# Patient Record
Sex: Male | Born: 1960 | Race: White | Hispanic: No | Marital: Married | Smoking: Never smoker
Health system: Southern US, Community
[De-identification: ages and names within clinical notes are randomized; demographics above are authoritative.]

## PROBLEM LIST (undated history)

## (undated) DIAGNOSIS — N400 Enlarged prostate without lower urinary tract symptoms: Secondary | ICD-10-CM

## (undated) DIAGNOSIS — F419 Anxiety disorder, unspecified: Secondary | ICD-10-CM

## (undated) DIAGNOSIS — E7800 Pure hypercholesterolemia, unspecified: Secondary | ICD-10-CM

## (undated) DIAGNOSIS — N182 Chronic kidney disease, stage 2 (mild): Secondary | ICD-10-CM

## (undated) DIAGNOSIS — F32A Depression, unspecified: Secondary | ICD-10-CM

## (undated) DIAGNOSIS — M7660 Achilles tendinitis, unspecified leg: Secondary | ICD-10-CM

## (undated) DIAGNOSIS — I10 Essential (primary) hypertension: Secondary | ICD-10-CM

## (undated) DIAGNOSIS — G4730 Sleep apnea, unspecified: Secondary | ICD-10-CM

## (undated) DIAGNOSIS — J45909 Unspecified asthma, uncomplicated: Secondary | ICD-10-CM

## (undated) DIAGNOSIS — K219 Gastro-esophageal reflux disease without esophagitis: Secondary | ICD-10-CM

## (undated) DIAGNOSIS — C4490 Unspecified malignant neoplasm of skin, unspecified: Secondary | ICD-10-CM

## (undated) HISTORY — PX: WISDOM TOOTH EXTRACTION: SHX21

## (undated) HISTORY — DX: Achilles tendinitis, unspecified leg: M76.60

## (undated) HISTORY — DX: Pure hypercholesterolemia, unspecified: E78.00

## (undated) HISTORY — PX: UPPER GASTROINTESTINAL ENDOSCOPY: SHX188

## (undated) HISTORY — DX: Unspecified asthma, uncomplicated: J45.909

## (undated) HISTORY — DX: Essential (primary) hypertension: I10

## (undated) HISTORY — PX: NOSE SURGERY: SHX723

---

## 2009-04-03 ENCOUNTER — Ambulatory Visit: Payer: Self-pay

## 2012-05-23 DIAGNOSIS — N138 Other obstructive and reflux uropathy: Secondary | ICD-10-CM | POA: Insufficient documentation

## 2012-05-23 DIAGNOSIS — E291 Testicular hypofunction: Secondary | ICD-10-CM | POA: Insufficient documentation

## 2013-07-27 ENCOUNTER — Encounter: Payer: Self-pay | Admitting: Podiatry

## 2013-07-31 ENCOUNTER — Encounter: Payer: Self-pay | Admitting: Podiatry

## 2013-07-31 ENCOUNTER — Ambulatory Visit (INDEPENDENT_AMBULATORY_CARE_PROVIDER_SITE_OTHER): Payer: 59 | Admitting: Podiatry

## 2013-07-31 VITALS — BP 141/79 | HR 76 | Resp 16 | Ht 72.0 in | Wt 222.0 lb

## 2013-07-31 DIAGNOSIS — M722 Plantar fascial fibromatosis: Secondary | ICD-10-CM

## 2013-07-31 NOTE — Progress Notes (Signed)
Subjective:    Patient ID: Blake Strickland, male    DOB: 02-05-1961, 52 y.o.   MRN: [id number]  HPI Comments: May need a new pair of orthotics or have the old ones repaired  pt was scanned for new orthotics     Review of Systems  HENT:       Sinus problems    Respiratory: Positive for cough.        Objective:   Physical Exam: He was seen by the nurse today for followup of his plantar fasciitis and to have new orthotics made.        Assessment & Plan:  Assessment: History of plantar fasciitis.  Plan: New orthotics scan today. Followup with him as needed.

## 2013-09-14 ENCOUNTER — Encounter: Payer: Self-pay | Admitting: Podiatry

## 2013-09-14 NOTE — Progress Notes (Signed)
Sent pt postcard letting him know orthotics are in. 

## 2013-09-25 ENCOUNTER — Encounter: Payer: Self-pay | Admitting: Podiatry

## 2014-08-17 HISTORY — PX: COLONOSCOPY: SHX174

## 2014-12-10 DIAGNOSIS — T7840XA Allergy, unspecified, initial encounter: Secondary | ICD-10-CM | POA: Insufficient documentation

## 2014-12-10 DIAGNOSIS — Z79899 Other long term (current) drug therapy: Secondary | ICD-10-CM | POA: Insufficient documentation

## 2014-12-10 DIAGNOSIS — F32A Depression, unspecified: Secondary | ICD-10-CM | POA: Insufficient documentation

## 2014-12-10 DIAGNOSIS — S022XXA Fracture of nasal bones, initial encounter for closed fracture: Secondary | ICD-10-CM | POA: Insufficient documentation

## 2014-12-10 DIAGNOSIS — B019 Varicella without complication: Secondary | ICD-10-CM | POA: Insufficient documentation

## 2014-12-10 DIAGNOSIS — I10 Essential (primary) hypertension: Secondary | ICD-10-CM | POA: Insufficient documentation

## 2014-12-10 DIAGNOSIS — F329 Major depressive disorder, single episode, unspecified: Secondary | ICD-10-CM | POA: Insufficient documentation

## 2015-05-06 ENCOUNTER — Encounter: Payer: Self-pay | Admitting: Podiatry

## 2015-05-06 ENCOUNTER — Ambulatory Visit (INDEPENDENT_AMBULATORY_CARE_PROVIDER_SITE_OTHER): Payer: 59 | Admitting: Podiatry

## 2015-05-06 DIAGNOSIS — M722 Plantar fascial fibromatosis: Secondary | ICD-10-CM | POA: Diagnosis not present

## 2015-05-06 NOTE — Progress Notes (Signed)
Subjective:    Patient ID: Blake Strickland, male    DOB: 18-Mar-1961, 54 y.o.   MRN: [id number]  HPI he presents today for follow-up of his Achilles tendinitis. He states that currently it is not active nor is his plantar fasciitis. He would like to consider having another pair orthotics made.    Review of Systems  Musculoskeletal: Positive for gait problem.       Objective:   Physical Exam: 54 year old male no distress presents vital signs stable alert and oriented 3. Pulses are strongly palpable bilateral. Neurologic sensorium is intact for Semmes-Weinstein monofilament. Deep tendon reflexes are intact bilateral and muscle strength is 5 over 5 dorsiflexion plantar flexors and inverters everters on physical musculature is intact. Orthopedic evaluation and this rates all joints distal to the ankle for range of motion without crepitation. No tenderness on palpation of the plantar fascia or the Achilles tendon bilateral. Cutaneous evaluation demonstrates supple well-hydrated cutis no erythema edema saline as drainage or odor.          Assessment & Plan:  History of plantar fasciitis and Achilles tendinitis.  Plan: He was scanned for a new set of orthotics today.

## 2015-06-11 ENCOUNTER — Telehealth: Payer: Self-pay | Admitting: Podiatry

## 2015-06-11 NOTE — Telephone Encounter (Signed)
Pt called to see if orthotics are in. Please call pt and advise.

## 2015-06-11 NOTE — Telephone Encounter (Signed)
Called pt to advise otho's are in. He will pick up today.

## 2015-10-21 ENCOUNTER — Ambulatory Visit (INDEPENDENT_AMBULATORY_CARE_PROVIDER_SITE_OTHER): Payer: 59 | Admitting: Podiatry

## 2015-10-21 ENCOUNTER — Encounter: Payer: Self-pay | Admitting: Podiatry

## 2015-10-21 ENCOUNTER — Ambulatory Visit (INDEPENDENT_AMBULATORY_CARE_PROVIDER_SITE_OTHER): Payer: 59

## 2015-10-21 VITALS — BP 139/93 | HR 68 | Resp 16

## 2015-10-21 DIAGNOSIS — S92912A Unspecified fracture of left toe(s), initial encounter for closed fracture: Secondary | ICD-10-CM | POA: Diagnosis not present

## 2015-10-21 DIAGNOSIS — S99922A Unspecified injury of left foot, initial encounter: Secondary | ICD-10-CM

## 2015-10-21 NOTE — Progress Notes (Signed)
He presents today with a chief complaint of a painful fifth digit of the left foot where he kicked a door frame proximally one month ago. He states the toe is still sore.  Objective: Vital signs are stable alert and oriented 3. Pulses are palpable. As some swelling to the fifth digit left foot overlying the proximal phalanx. Mildly tender on palpation. Radiographs do confirm a nondisplaced fracture mid diaphyseal region of the proximal phalanx fifth digit left foot. Fracture is also confirmed by bone callus development.  Assessment: Fracture proximal phalanx fifth digit left foot non-comminuted nondisplaced.  Plan: I encouraged him to wrap the toe on a daily basis and get back to his regular routine he will go on to heal just fine.

## 2016-04-06 ENCOUNTER — Ambulatory Visit (INDEPENDENT_AMBULATORY_CARE_PROVIDER_SITE_OTHER): Payer: 59 | Admitting: Podiatry

## 2016-04-06 ENCOUNTER — Ambulatory Visit (INDEPENDENT_AMBULATORY_CARE_PROVIDER_SITE_OTHER): Payer: 59

## 2016-04-06 ENCOUNTER — Encounter: Payer: Self-pay | Admitting: Podiatry

## 2016-04-06 VITALS — BP 109/88 | HR 69 | Resp 12

## 2016-04-06 DIAGNOSIS — M7661 Achilles tendinitis, right leg: Secondary | ICD-10-CM | POA: Diagnosis not present

## 2016-04-06 DIAGNOSIS — S92912D Unspecified fracture of left toe(s), subsequent encounter for fracture with routine healing: Secondary | ICD-10-CM | POA: Diagnosis not present

## 2016-04-06 DIAGNOSIS — M7671 Peroneal tendinitis, right leg: Secondary | ICD-10-CM | POA: Diagnosis not present

## 2016-04-06 MED ORDER — MELOXICAM 15 MG PO TABS: 15.0000 mg | ORAL_TABLET | Freq: Every day | ORAL | 3 refills | Status: DC

## 2016-04-06 NOTE — Progress Notes (Signed)
Blake Strickland presents today with a chief complaint of pain to the lateral and posterior lateral aspect of the right ankle. He states that after he stopped running the pain has resolved. He states he wears his orthotics regularly.  Objective: Vital signs are stable alert and oriented 3. Pulses are palpable. No erythema edema saline as drainage or odor. He has some fluid collection around the peroneal tendons with no tenderness on palpation or on eversion and abduction against resistance. Redress do not demonstrate type of osseous abnormalities.  Assessment: Peroneal tendinitis possibly associated with poor shoe gear.  Plan: I will give him an oral anti-inflammatory and recommend new shoe gear with the lateral flange and I will follow-up with him as needed.

## 2016-08-20 DIAGNOSIS — E291 Testicular hypofunction: Secondary | ICD-10-CM | POA: Diagnosis not present

## 2016-08-20 DIAGNOSIS — Z79899 Other long term (current) drug therapy: Secondary | ICD-10-CM | POA: Diagnosis not present

## 2016-08-20 DIAGNOSIS — N401 Enlarged prostate with lower urinary tract symptoms: Secondary | ICD-10-CM | POA: Diagnosis not present

## 2017-02-02 ENCOUNTER — Other Ambulatory Visit: Payer: Self-pay | Admitting: Family Medicine

## 2017-02-02 ENCOUNTER — Encounter: Payer: Self-pay | Admitting: *Deleted

## 2017-02-02 DIAGNOSIS — Z8249 Family history of ischemic heart disease and other diseases of the circulatory system: Secondary | ICD-10-CM

## 2017-02-02 DIAGNOSIS — K429 Umbilical hernia without obstruction or gangrene: Secondary | ICD-10-CM | POA: Diagnosis not present

## 2017-02-19 ENCOUNTER — Ambulatory Visit
Admission: RE | Admit: 2017-02-19 | Discharge: 2017-02-19 | Disposition: A | Discharge status: Home / Self Care | Payer: 59 | Source: Ambulatory Visit | Attending: Family Medicine | Admitting: Family Medicine

## 2017-02-19 DIAGNOSIS — Z8249 Family history of ischemic heart disease and other diseases of the circulatory system: Secondary | ICD-10-CM | POA: Insufficient documentation

## 2017-02-22 ENCOUNTER — Ambulatory Visit (INDEPENDENT_AMBULATORY_CARE_PROVIDER_SITE_OTHER): Payer: 59 | Admitting: General Surgery

## 2017-02-22 ENCOUNTER — Encounter: Payer: Self-pay | Admitting: General Surgery

## 2017-02-22 VITALS — BP 140/80 | HR 74 | Resp 12 | Ht 72.0 in | Wt 239.0 lb

## 2017-02-22 DIAGNOSIS — K429 Umbilical hernia without obstruction or gangrene: Secondary | ICD-10-CM

## 2017-02-22 NOTE — Patient Instructions (Signed)
Umbilical Hernia, Adult A hernia is a bulge of tissue that pushes through an opening between muscles. An umbilical hernia happens in the abdomen, near the belly button (umbilicus). The hernia may contain tissues from the small intestine, large intestine, or fatty tissue covering the intestines (omentum). Umbilical hernias in adults tend to get worse over time, and they require surgical treatment. There are several types of umbilical hernias. You may have:  A hernia located just above or below the umbilicus (indirect hernia). This is the most common type of umbilical hernia in adults.  A hernia that forms through an opening formed by the umbilicus (direct hernia).  A hernia that comes and goes (reducible hernia). A reducible hernia may be visible only when you strain, lift something heavy, or cough. This type of hernia can be pushed back into the abdomen (reduced).  A hernia that traps abdominal tissue inside the hernia (incarcerated hernia). This type of hernia cannot be reduced.  A hernia that cuts off blood flow to the tissues inside the hernia (strangulated hernia). The tissues can start to die if this happens. This type of hernia requires emergency treatment.  What are the causes? An umbilical hernia happens when tissue inside the abdomen presses on a weak area of the abdominal muscles. What increases the risk? You may have a greater risk of this condition if you:  Are obese.  Have had several pregnancies.  Have a buildup of fluid inside your abdomen (ascites).  Have had surgery that weakens the abdominal muscles.  What are the signs or symptoms? The main symptom of this condition is a painless bulge at or near the belly button. A reducible hernia may be visible only when you strain, lift something heavy, or cough. Other symptoms may include:  Dull pain.  A feeling of pressure.  Symptoms of a strangulated hernia may include:  Pain that gets increasingly worse.  Nausea and  vomiting.  Pain when pressing on the hernia.  Skin over the hernia becoming red or purple.  Constipation.  Blood in the stool.  How is this diagnosed? This condition may be diagnosed based on:  A physical exam. You may be asked to cough or strain while standing. These actions increase the pressure inside your abdomen and force the hernia through the opening in your muscles. Your health care provider may try to reduce the hernia by pressing on it.  Your symptoms and medical history.  How is this treated? Surgery is the only treatment for an umbilical hernia. Surgery for a strangulated hernia is done as soon as possible. If you have a small hernia that is not incarcerated, you may need to lose weight before having surgery. Follow these instructions at home:  Lose weight, if told by your health care provider.  Do not try to push the hernia back in.  Watch your hernia for any changes in color or size. Tell your health care provider if any changes occur.  You may need to avoid activities that increase pressure on your hernia.  Do not lift anything that is heavier than 10 lb (4.5 kg) until your health care provider says that this is safe.  Take over-the-counter and prescription medicines only as told by your health care provider.  Keep all follow-up visits as told by your health care provider. This is important. Contact a health care provider if:  Your hernia gets larger.  Your hernia becomes painful. Get help right away if:  You develop sudden, severe pain near the   area of your hernia.  You have pain as well as nausea or vomiting.  You have pain and the skin over your hernia changes color.  You develop a fever. This information is not intended to replace advice given to you by your health care provider. Make sure you discuss any questions you have with your health care provider. Document Released: 01/03/2016 Document Revised: 04/05/2016 Document Reviewed:  01/03/2016 Elsevier Interactive Patient Education  2018 Elsevier Inc.  

## 2017-02-22 NOTE — Progress Notes (Signed)
Patient ID: Blake Strickland, male   DOB: 25-Jan-1961, 56 y.o.   MRN: [id number]  Chief Complaint  Patient presents with  . Umbilical Hernia    HPI Blake Strickland is a 56 y.o. male here today for a evaluation of a umbilical hernia. Patient states he noticed this area about six weeks ago while moving his bowels.  No pain or discomfort.   He lift weights while working out. Typically will lift 230 pounds or less.   Moves is bowels daily. No urinary symptoms. HPI  Past Medical History:  Diagnosis Date  . Achilles tendonitis   . Asthma   . Elevated cholesterol   . Hypertension     Past Surgical History:  Procedure Laterality Date  . COLONOSCOPY  2016  . NOSE SURGERY      No family history on file.  Social History Social History  Substance Use Topics  . Smoking status: Never Smoker  . Smokeless tobacco: Never Used  . Alcohol use Yes    No Known Allergies  Current Outpatient Prescriptions  Medication Sig Dispense Refill  . aspirin 81 MG chewable tablet Chew by mouth daily.    Marland Kitchen atorvastatin (LIPITOR) 80 MG tablet     . FLUoxetine (PROZAC) 10 MG tablet Take 10 mg by mouth daily.    Marland Kitchen lamoTRIgine (LAMICTAL) 200 MG tablet Take 200 mg by mouth at bedtime.  2  . lisinopril (PRINIVIL,ZESTRIL) 20 MG tablet Take 20 mg by mouth daily.    . simvastatin (ZOCOR) 20 MG tablet TAKE 1 TABLET (20 MG TOTAL) BY MOUTH NIGHTLY.  5   No current facility-administered medications for this visit.     Review of Systems Review of Systems  Constitutional: Negative.   Respiratory: Negative.   Cardiovascular: Negative.   Gastrointestinal: Negative.     Blood pressure 140/80, pulse 74, resp. rate 12, height 6' (1.829 m), weight 239 lb (108.4 kg).  Physical Exam Physical Exam  Constitutional: He is oriented to person, place, and time. He appears well-developed and well-nourished.  Eyes: Conjunctivae are normal. No scleral icterus.  Neck: Neck supple.  Cardiovascular: Normal rate, regular  rhythm and normal heart sounds.   Pulmonary/Chest: Effort normal and breath sounds normal.  Abdominal: Soft. Normal appearance and bowel sounds are normal. There is no hepatomegaly. There is no tenderness. A hernia ( small umbilical hernia) is present.    Lymphadenopathy:    He has no cervical adenopathy.  Neurological: He is alert and oriented to person, place, and time.  Skin: Skin is warm and dry.    Data Reviewed CBC and comprehensive metabolic panel from 05/12/2016 were both normal. Estimated GFR 63. Creatinine 1.2.   Assessment    Small, asymptomatic umbilical hernia.    Plan         Hernia precautions and incarceration were discussed with the patient. If they develop symptoms of an incarcerated hernia, they were encouraged to seek prompt medical attention.  Indication for elective repair were reviewed: Local discomfort with or without activity, increasing size.  Exceptionally low risk for incarceration (patient's main concern after consult thing Dr.Google). .  Patient to call if he desires to proceed with elective repair.   Based on his present exam, wouldn't dissipate a primary repair without the need for prosthetic mesh.  HPI, Physical Exam, Assessment and Plan have been scribed under the direction and in the presence of Donnalee Curry, MD.  [name], CMA  I have completed the exam and reviewed the above documentation  for accuracy and completeness.  I agree with the above.  Museum/gallery conservator has been used and any errors in dictation or transcription are unintentional.  Donnalee Curry, M.D., F.A.C.S.   [name] 02/22/2017, 2:43 PM

## 2017-04-26 DIAGNOSIS — R5381 Other malaise: Secondary | ICD-10-CM | POA: Diagnosis not present

## 2017-04-26 DIAGNOSIS — E785 Hyperlipidemia, unspecified: Secondary | ICD-10-CM | POA: Diagnosis not present

## 2017-04-26 DIAGNOSIS — Z0000 Encounter for general adult medical examination without abnormal findings: Secondary | ICD-10-CM | POA: Diagnosis not present

## 2017-05-14 DIAGNOSIS — J069 Acute upper respiratory infection, unspecified: Secondary | ICD-10-CM | POA: Diagnosis not present

## 2017-05-14 DIAGNOSIS — R05 Cough: Secondary | ICD-10-CM | POA: Diagnosis not present

## 2017-05-26 DIAGNOSIS — J019 Acute sinusitis, unspecified: Secondary | ICD-10-CM | POA: Diagnosis not present

## 2017-05-26 DIAGNOSIS — J40 Bronchitis, not specified as acute or chronic: Secondary | ICD-10-CM | POA: Diagnosis not present

## 2017-06-07 DIAGNOSIS — Z0000 Encounter for general adult medical examination without abnormal findings: Secondary | ICD-10-CM | POA: Diagnosis not present

## 2017-06-11 DIAGNOSIS — J019 Acute sinusitis, unspecified: Secondary | ICD-10-CM | POA: Diagnosis not present

## 2017-06-11 DIAGNOSIS — H6122 Impacted cerumen, left ear: Secondary | ICD-10-CM | POA: Diagnosis not present

## 2017-06-29 DIAGNOSIS — R7989 Other specified abnormal findings of blood chemistry: Secondary | ICD-10-CM | POA: Diagnosis not present

## 2017-06-29 DIAGNOSIS — R5383 Other fatigue: Secondary | ICD-10-CM | POA: Diagnosis not present

## 2017-06-29 DIAGNOSIS — Z0000 Encounter for general adult medical examination without abnormal findings: Secondary | ICD-10-CM | POA: Diagnosis not present

## 2017-06-29 DIAGNOSIS — R5381 Other malaise: Secondary | ICD-10-CM | POA: Diagnosis not present

## 2017-07-12 DIAGNOSIS — Z23 Encounter for immunization: Secondary | ICD-10-CM | POA: Diagnosis not present

## 2017-07-16 ENCOUNTER — Ambulatory Visit (INDEPENDENT_AMBULATORY_CARE_PROVIDER_SITE_OTHER): Payer: 59 | Admitting: Urology

## 2017-07-16 VITALS — BP 129/92 | HR 94 | Ht 72.0 in | Wt 231.1 lb

## 2017-07-16 DIAGNOSIS — N138 Other obstructive and reflux uropathy: Secondary | ICD-10-CM

## 2017-07-16 DIAGNOSIS — N401 Enlarged prostate with lower urinary tract symptoms: Secondary | ICD-10-CM

## 2017-07-16 DIAGNOSIS — R350 Frequency of micturition: Secondary | ICD-10-CM | POA: Diagnosis not present

## 2017-07-16 DIAGNOSIS — E291 Testicular hypofunction: Secondary | ICD-10-CM | POA: Diagnosis not present

## 2017-07-16 LAB — URINALYSIS, COMPLETE
BILIRUBIN UA: NEGATIVE
Glucose, UA: NEGATIVE
KETONES UA: NEGATIVE
NITRITE UA: NEGATIVE
PH UA: 5 (ref 5.0–7.5)
Protein, UA: NEGATIVE
UUROB: 0.2 mg/dL (ref 0.2–1.0)

## 2017-07-16 LAB — MICROSCOPIC EXAMINATION: RBC, UA: NONE SEEN /hpf (ref 0–?)

## 2017-07-16 LAB — BLADDER SCAN AMB NON-IMAGING

## 2017-07-16 NOTE — Progress Notes (Signed)
07/16/2017 1:46 PM   Blake Strickland [birth year] [id number]  Referring provider: [name], MD [address] [place], [place] [address]  Chief Complaint  Patient presents with  . Urinary Frequency    HPI: 56 y.o.No male presents for follow-up.  He has previous been followed for hypogonadism and was initially on topical testosterone and switch to Clomid for insurance reasons.  I last saw him at [facility] in October 2017.  He had been off Clomid for several months.  He recently saw Dr. Burnett Sheng and his testosterone level was low at 269 ng/dL.  PSA was 0.36.  He just started testosterone back Clomid back 1 week ago.  He has mild urinary frequency and urgency which is not bothersome.   PMH: Past Medical History:  Diagnosis Date  . Achilles tendonitis   . Asthma   . Elevated cholesterol   . Hypertension     Surgical History: Past Surgical History:  Procedure Laterality Date  . COLONOSCOPY  2016  . NOSE SURGERY      Home Medications:  Allergies as of 07/16/2017      Reactions   Other Other (See Comments)   Intolerance to eggs - "sinus drainage" Intolerance to eggs - "sinus drainage"      Medication List        Accurate as of 07/16/17  1:46 PM. Always use your most recent med list.          aspirin 81 MG chewable tablet Chew by mouth daily.   FLUoxetine 10 MG tablet Commonly known as:  PROZAC Take 10 mg by mouth daily.   lamoTRIgine 200 MG tablet Commonly known as:  LAMICTAL Take 200 mg by mouth at bedtime.   lisinopril 20 MG tablet Commonly known as:  PRINIVIL,ZESTRIL Take 20 mg by mouth daily.   simvastatin 20 MG tablet Commonly known as:  ZOCOR TAKE 1 TABLET (20 MG TOTAL) BY MOUTH NIGHTLY.       Allergies:  Allergies  Allergen Reactions  . Other Other (See Comments)    Intolerance to eggs - "sinus drainage" Intolerance to eggs - "sinus drainage"     Family History: No family history on file.  Social History:  reports  that  has never smoked. he has never used smokeless tobacco. He reports that he drinks alcohol. He reports that he does not use drugs.  ROS: UROLOGY Frequent Urination?: Yes Hard to postpone urination?: No Burning/pain with urination?: No Get up at night to urinate?: Yes Leakage of urine?: No Urine stream starts and stops?: No Trouble starting stream?: No Do you have to strain to urinate?: No Blood in urine?: No Urinary tract infection?: No Sexually transmitted disease?: No Injury to kidneys or bladder?: No Painful intercourse?: No Weak stream?: No Erection problems?: No Penile pain?: No  Gastrointestinal Nausea?: No Vomiting?: No Indigestion/heartburn?: No Diarrhea?: No Constipation?: No  Constitutional Fever: No Night sweats?: No Weight loss?: No Fatigue?: No  Skin Skin rash/lesions?: No Itching?: No  Eyes Blurred vision?: No Double vision?: No  Ears/Nose/Throat Sore throat?: No Sinus problems?: No  Hematologic/Lymphatic Swollen glands?: No Easy bruising?: No  Cardiovascular Leg swelling?: No Chest pain?: No  Respiratory Cough?: No Shortness of breath?: No  Endocrine Excessive thirst?: No  Musculoskeletal Back pain?: No Joint pain?: No  Neurological Headaches?: No Dizziness?: No  Psychologic Depression?: No Anxiety?: No  Physical Exam: BP (!) 129/92 (BP Location: Right Arm, Patient Position: Sitting, Cuff Size: Large)   Pulse 94  Ht 6' (1.829 m)   Wt 231 lb 1.6 oz (104.8 kg)   BMI 31.34 kg/m   Constitutional:  Alert and oriented, No acute distress. HEENT: [place] AT, moist mucus membranes.  Trachea midline, no masses. Cardiovascular: No clubbing, cyanosis, or edema. Respiratory: Normal respiratory effort, no increased work of breathing. GI: Abdomen is soft, nontender, nondistended, no abdominal masses GU: No CVA tenderness.  Prostate 40 g, smooth without nodules Skin: No rashes, bruises or suspicious lesions. Lymph: No cervical or  inguinal adenopathy. Neurologic: Grossly intact, no focal deficits, moving all 4 extremities. Psychiatric: Normal mood and affect.   Assessment & Plan:   1. Urine frequency Most likely secondary to mild BPH.  PVR by bladder scan was 74 mL.  He desires to hold off on treatment at this time.  - Bladder Scan (Post Void Residual) in office - Urinalysis, Complete  2.  Hypogonadism He just restarted Clomid and will check a testosterone level in 3-4 months.  Return in about 1 year (around 07/16/2018) for Recheck.    [name], MD  [facility] [address], [address] [place], [place] [address] [phone]

## 2017-11-09 ENCOUNTER — Other Ambulatory Visit: Payer: Self-pay

## 2017-11-09 MED ORDER — CLOMIPHENE CITRATE 50 MG PO TABS: ORAL_TABLET | ORAL | 3 refills | Status: DC

## 2017-11-15 ENCOUNTER — Other Ambulatory Visit: Payer: 59

## 2017-11-15 ENCOUNTER — Encounter: Payer: Self-pay | Admitting: Urology

## 2017-11-15 ENCOUNTER — Other Ambulatory Visit: Payer: Self-pay

## 2017-11-15 DIAGNOSIS — E291 Testicular hypofunction: Secondary | ICD-10-CM

## 2017-12-07 DIAGNOSIS — H6123 Impacted cerumen, bilateral: Secondary | ICD-10-CM | POA: Diagnosis not present

## 2017-12-07 DIAGNOSIS — J019 Acute sinusitis, unspecified: Secondary | ICD-10-CM | POA: Diagnosis not present

## 2017-12-30 DIAGNOSIS — K219 Gastro-esophageal reflux disease without esophagitis: Secondary | ICD-10-CM | POA: Diagnosis not present

## 2017-12-30 DIAGNOSIS — R05 Cough: Secondary | ICD-10-CM | POA: Diagnosis not present

## 2017-12-30 DIAGNOSIS — I10 Essential (primary) hypertension: Secondary | ICD-10-CM | POA: Diagnosis not present

## 2018-02-28 DIAGNOSIS — L570 Actinic keratosis: Secondary | ICD-10-CM | POA: Diagnosis not present

## 2018-02-28 DIAGNOSIS — L918 Other hypertrophic disorders of the skin: Secondary | ICD-10-CM | POA: Diagnosis not present

## 2018-02-28 DIAGNOSIS — L578 Other skin changes due to chronic exposure to nonionizing radiation: Secondary | ICD-10-CM | POA: Diagnosis not present

## 2018-04-08 DIAGNOSIS — I10 Essential (primary) hypertension: Secondary | ICD-10-CM | POA: Diagnosis not present

## 2018-05-03 DIAGNOSIS — Z23 Encounter for immunization: Secondary | ICD-10-CM | POA: Diagnosis not present

## 2018-05-30 DIAGNOSIS — Z23 Encounter for immunization: Secondary | ICD-10-CM | POA: Diagnosis not present

## 2018-07-18 ENCOUNTER — Ambulatory Visit: Payer: 59 | Admitting: Urology

## 2018-07-18 IMAGING — US US RETROPERITONEAL COMPLETE
1 series · 14 of 25 positions shown · non-contrast
Comparison: None.

CLINICAL DATA: Family history AAA

EXAM:
ULTRASOUND RETROPERITONEAL COMPLETE
TECHNIQUE: Ultrasound examination of the abdominal aorta was performed to
evaluate for abdominal aortic aneurysm. The common iliac arteries,
IVC, and kidneys were also evaluated.

[Series 1: us retroperitoneal complete · 0.28mm/px · 14 of 57 slices shown]
[im 1/57]
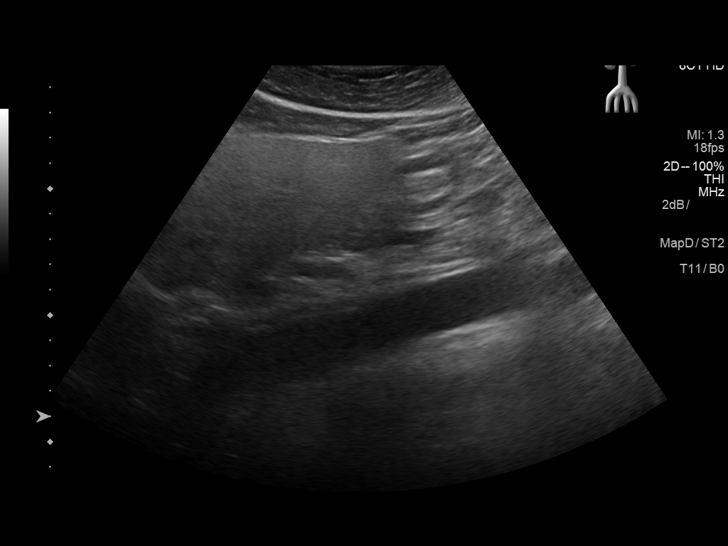
[im 5/57]
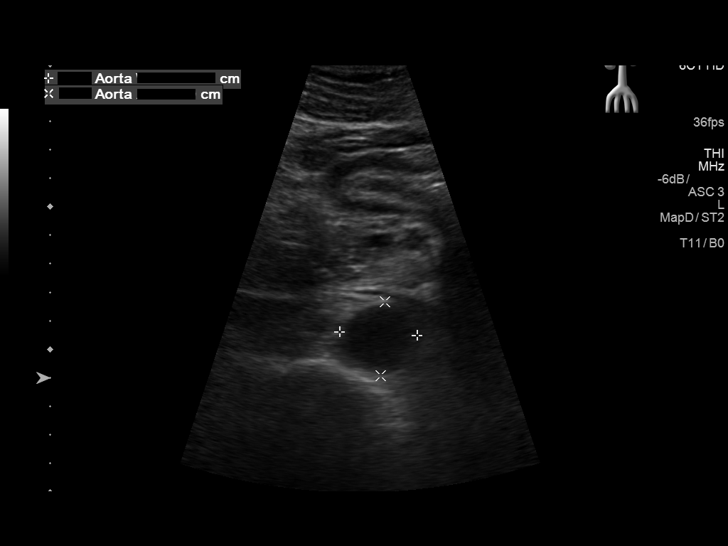
[im 10/57]
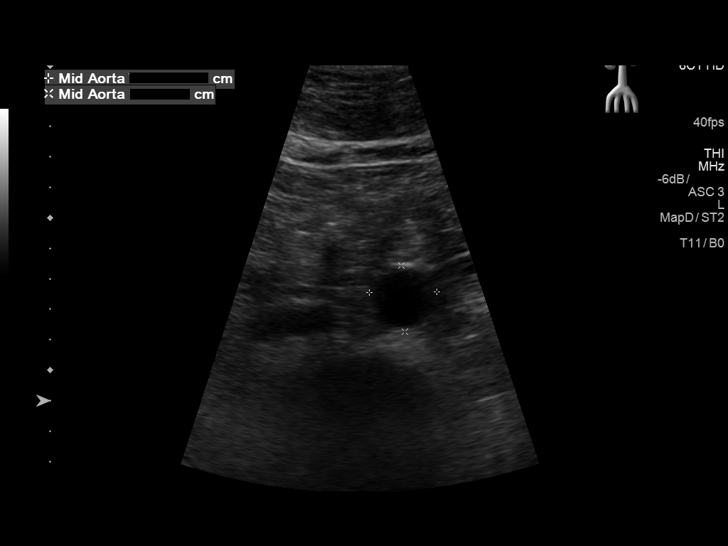
[im 15/57]
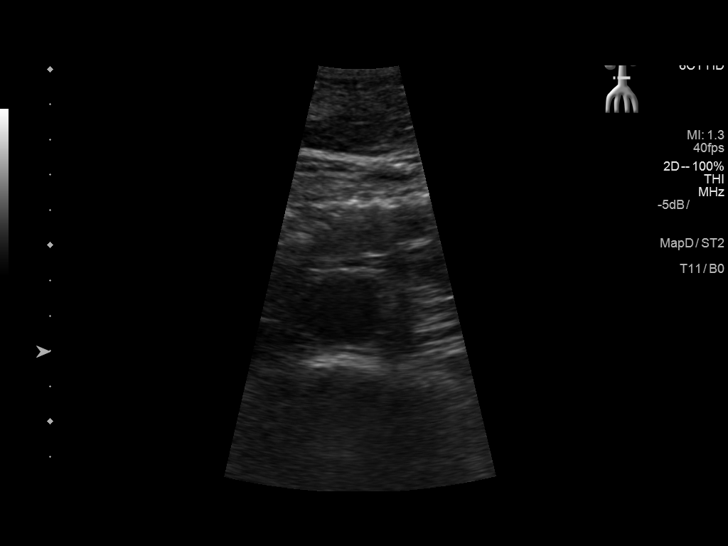
[im 19/57]
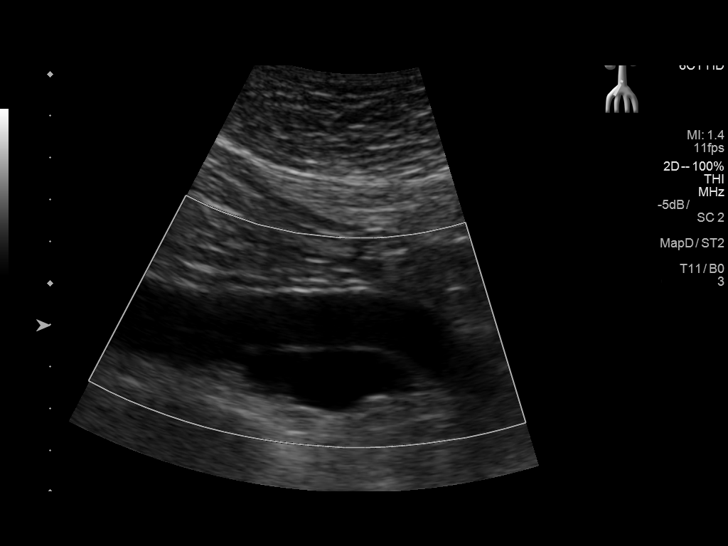
[im 22/57]
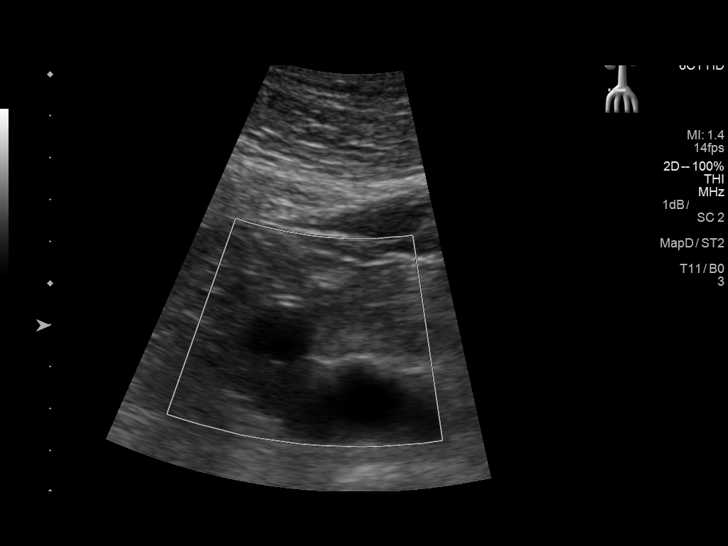
[im 26/57]
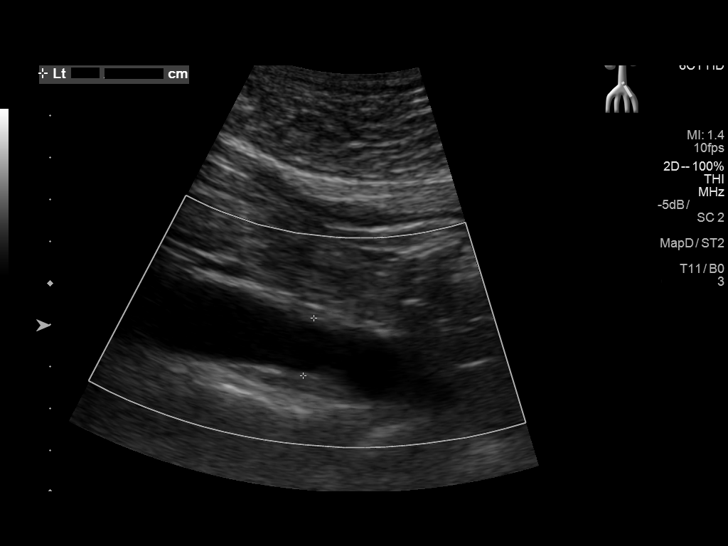
[im 31/57]
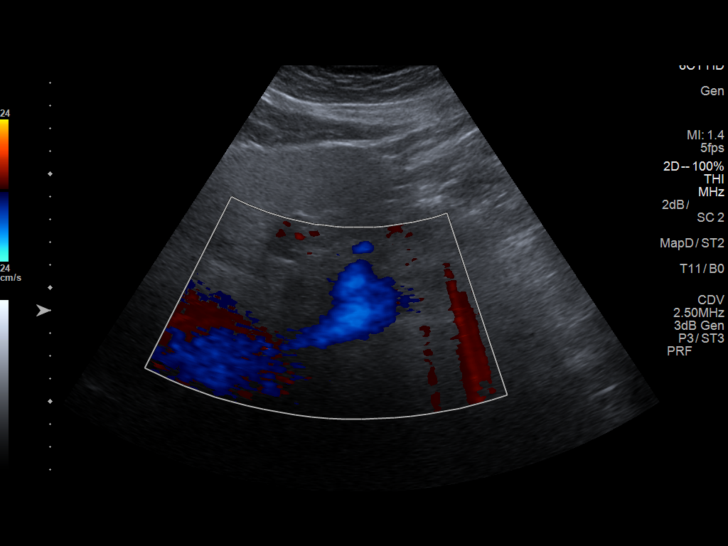
[im 36/57]
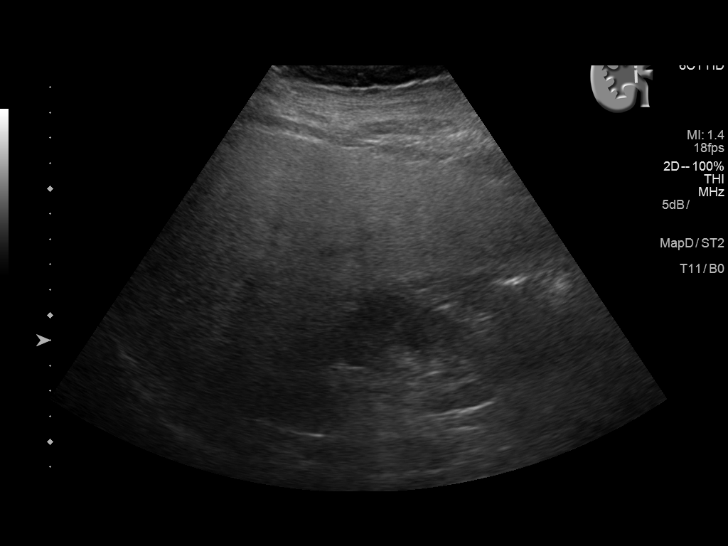
[im 38/57]
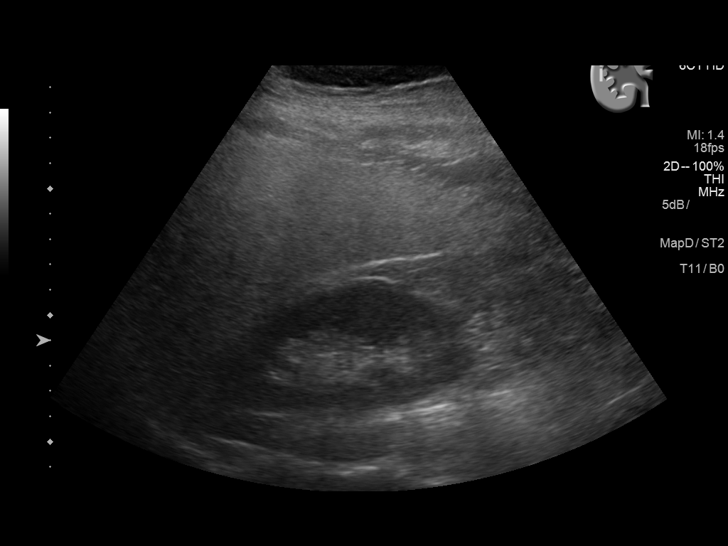
[im 43/57]
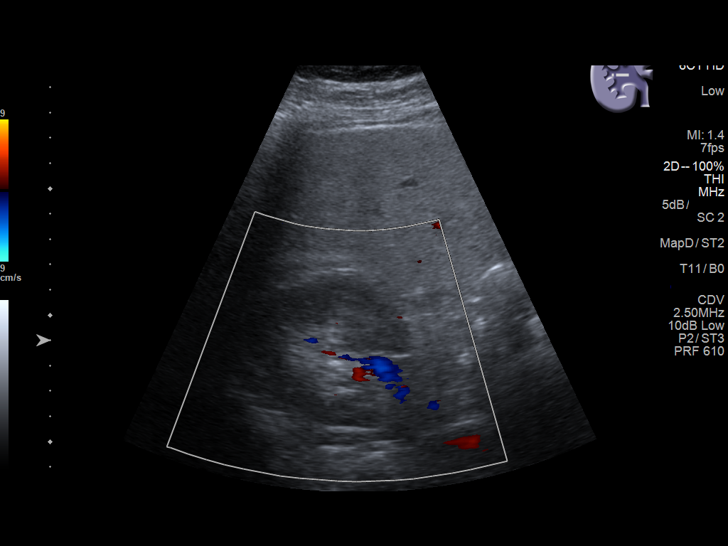
[im 47/57]
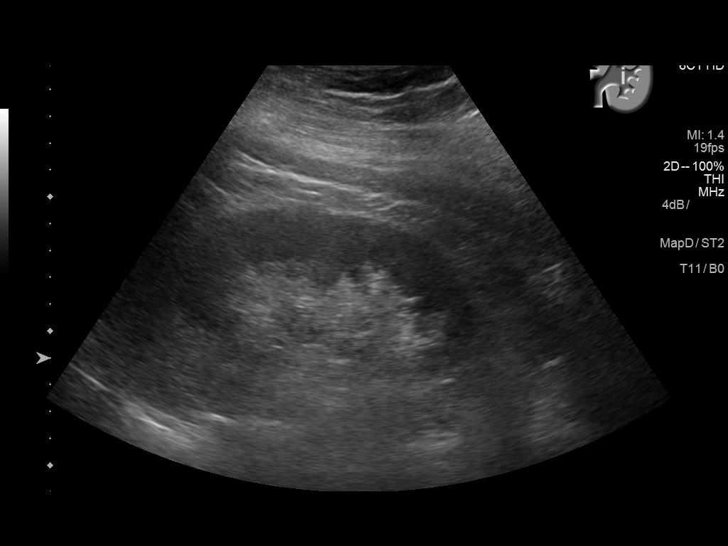
[im 52/57]
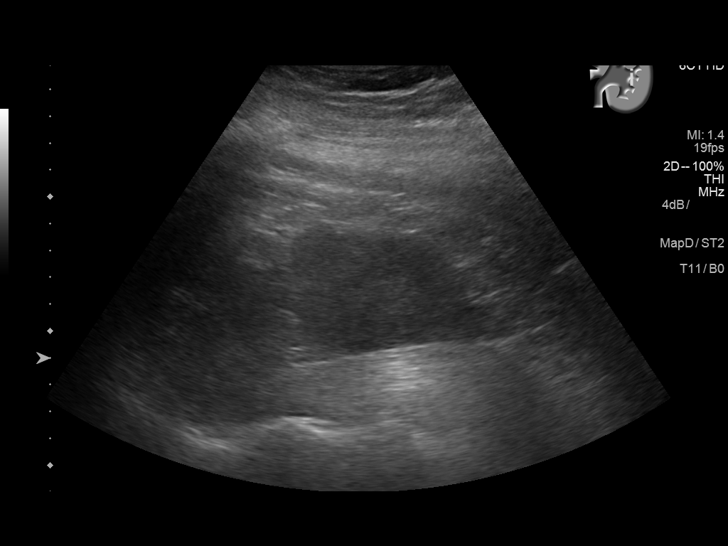
[im 57/57]
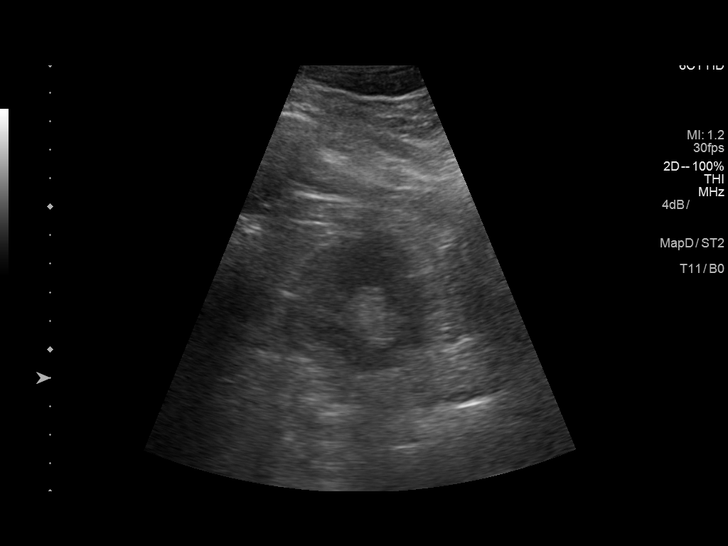

[14 of 25 positions shown; findings below may reference images not displayed]

FINDINGS: Abdominal Aorta

No aneurysm identified.

Maximum AP

Diameter:  2.8 cm

Maximum TRV

Diameter: 2.7 cm

Right Common Iliac Artery

No aneurysm identified, 1.4 cm maximally.

Left Common Iliac Artery

No aneurysm identified, 1.4 cm maximally.

IVC

No abnormality visualized.

Right Kidney

Length: 11.1 cm Echogenicity within normal limits. No mass or
hydronephrosis visualized.

Left Kidney

Length: 11.4 cm Echogenicity within normal limits. No mass or
hydronephrosis visualized.
IMPRESSION: No evidence of abdominal aortic aneurysm.

## 2018-08-26 ENCOUNTER — Ambulatory Visit: Payer: 59 | Admitting: Urology

## 2018-09-15 DIAGNOSIS — Z0000 Encounter for general adult medical examination without abnormal findings: Secondary | ICD-10-CM | POA: Diagnosis not present

## 2018-09-15 DIAGNOSIS — R739 Hyperglycemia, unspecified: Secondary | ICD-10-CM | POA: Diagnosis not present

## 2018-09-15 DIAGNOSIS — Z125 Encounter for screening for malignant neoplasm of prostate: Secondary | ICD-10-CM | POA: Diagnosis not present

## 2018-09-21 DIAGNOSIS — Z0000 Encounter for general adult medical examination without abnormal findings: Secondary | ICD-10-CM | POA: Diagnosis not present

## 2018-09-21 DIAGNOSIS — I10 Essential (primary) hypertension: Secondary | ICD-10-CM | POA: Diagnosis not present

## 2018-09-21 DIAGNOSIS — E785 Hyperlipidemia, unspecified: Secondary | ICD-10-CM | POA: Diagnosis not present

## 2018-10-14 DIAGNOSIS — Z79899 Other long term (current) drug therapy: Secondary | ICD-10-CM | POA: Diagnosis not present

## 2018-11-04 ENCOUNTER — Ambulatory Visit: Payer: 59 | Admitting: Urology

## 2019-01-13 ENCOUNTER — Ambulatory Visit: Payer: 59 | Admitting: Urology

## 2019-01-27 ENCOUNTER — Other Ambulatory Visit: Payer: Self-pay | Admitting: Nephrology

## 2019-01-27 ENCOUNTER — Other Ambulatory Visit: Payer: Self-pay

## 2019-01-27 DIAGNOSIS — N183 Chronic kidney disease, stage 3 unspecified: Secondary | ICD-10-CM

## 2019-01-31 ENCOUNTER — Encounter: Payer: Self-pay | Admitting: Urology

## 2019-01-31 ENCOUNTER — Ambulatory Visit (INDEPENDENT_AMBULATORY_CARE_PROVIDER_SITE_OTHER): Payer: 59 | Admitting: Urology

## 2019-01-31 ENCOUNTER — Other Ambulatory Visit: Payer: Self-pay

## 2019-01-31 VITALS — BP 132/81 | HR 74 | Ht 72.0 in | Wt 222.2 lb

## 2019-01-31 DIAGNOSIS — N401 Enlarged prostate with lower urinary tract symptoms: Secondary | ICD-10-CM | POA: Diagnosis not present

## 2019-01-31 DIAGNOSIS — R350 Frequency of micturition: Secondary | ICD-10-CM | POA: Diagnosis not present

## 2019-01-31 NOTE — Progress Notes (Signed)
01/31/2019 10:17 AM   Blake Strickland [birth year] [id number]  Referring provider: Maryland Pink, MD [address] [place],  [place] [address]  Chief Complaint  Patient presents with  . Follow-up    HPI: 58 year old male previously seen by me at [facility] for hypogonadism initially on topical testosterone and switching to Clomid.  He has mild lower urinary tract symptoms consisting of occasional urinary frequency and nocturia x1-2.  He feels his voiding symptoms have improved since starting an intermittent fasting diet.  He is no longer on Clomid.  PSA performed by his PCP January 2020 was stable at 0.21.  He recently saw a nephrologist for abnormal EGFR.Marland Kitchen  He is scheduled for a renal ultrasound.  There was also a note regarding microscopic hematuria however his recent UAs were reviewed and he has trace blood on dipstick however no significant RBCs on microscopy.   PMH: Past Medical History:  Diagnosis Date  . Achilles tendonitis   . Asthma   . Elevated cholesterol   . Hypertension     Surgical History: Past Surgical History:  Procedure Laterality Date  . COLONOSCOPY  2016  . NOSE SURGERY      Home Medications:  Allergies as of 01/31/2019      Reactions   Other Other (See Comments)   Intolerance to eggs - "sinus drainage" Intolerance to eggs - "sinus drainage"      Medication List       Accurate as of January 31, 2019 10:17 AM. If you have any questions, ask your nurse or doctor.        STOP taking these medications   aspirin 81 MG chewable tablet Stopped by: Abbie Sons, MD   clomiPHENE 50 MG tablet Commonly known as: CLOMID Stopped by: Abbie Sons, MD   fluticasone 50 MCG/ACT nasal spray Commonly known as: FLONASE Stopped by: Abbie Sons, MD   lisinopril 20 MG tablet Commonly known as: ZESTRIL Stopped by: Abbie Sons, MD     TAKE these medications   DEPLIN PO Take by mouth.   FLUoxetine 10 MG tablet Commonly known  as: PROZAC Take 30 mg by mouth daily. What changed: Another medication with the same name was removed. Continue taking this medication, and follow the directions you see here. Changed by: Abbie Sons, MD   lamoTRIgine 100 MG tablet Commonly known as: LAMICTAL Take 300 mg by mouth daily. What changed: Another medication with the same name was removed. Continue taking this medication, and follow the directions you see here. Changed by: Abbie Sons, MD   simvastatin 20 MG tablet Commonly known as: ZOCOR Take 20 mg by mouth daily.   telmisartan 40 MG tablet Commonly known as: MICARDIS TAKE 1 TABLET BY MOUTH EVERY DAY       Allergies:  Allergies  Allergen Reactions  . Other Other (See Comments)    Intolerance to eggs - "sinus drainage" Intolerance to eggs - "sinus drainage"     Family History: No family history on file.  Social History:  reports that he has never smoked. He has never used smokeless tobacco. He reports current alcohol use. He reports that he does not use drugs.  ROS: UROLOGY Frequent Urination?: No Hard to postpone urination?: No Burning/pain with urination?: No Get up at night to urinate?: Yes Leakage of urine?: No Urine stream starts and stops?: No Trouble starting stream?: No Do you have to strain to urinate?: No Blood in urine?: Yes Urinary   01/31/2019 10:17 AM   Blake Strickland 04/22/1961 [id number]  Referring provider: Hedrick, James, MD [address] [facility] [place],  [place] [address]  Chief Complaint  Patient presents with  . Follow-up    HPI: 58-year-old male previously seen by me at [facility] for hypogonadism initially on topical testosterone and switching to Clomid.  He has mild lower urinary tract symptoms consisting of occasional urinary frequency and nocturia x1-2.  He feels his voiding symptoms have improved since starting an intermittent fasting diet.  He is no longer on Clomid.  PSA performed by his PCP January 2020 was stable at 0.21.  He recently saw a nephrologist for abnormal EGFR..  He is scheduled for a renal ultrasound.  There was also a note regarding microscopic hematuria however his recent UAs were reviewed and he has trace blood on dipstick however no significant RBCs on microscopy.   PMH: Past Medical History:  Diagnosis Date  . Achilles tendonitis   . Asthma   . Elevated cholesterol   . Hypertension     Surgical History: Past Surgical History:  Procedure Laterality Date  . COLONOSCOPY  2016  . NOSE SURGERY      Home Medications:  Allergies as of 01/31/2019      Reactions   Other Other (See Comments)   Intolerance to eggs - "sinus drainage" Intolerance to eggs - "sinus drainage"      Medication List       Accurate as of January 31, 2019 10:17 AM. If you have any questions, ask your nurse or doctor.        STOP taking these medications   aspirin 81 MG chewable tablet Stopped by: Scott C Stoioff, MD   clomiPHENE 50 MG tablet Commonly known as: CLOMID Stopped by: Scott C Stoioff, MD   fluticasone 50 MCG/ACT nasal spray Commonly known as: FLONASE Stopped by: Scott C Stoioff, MD   lisinopril 20 MG tablet Commonly known as: ZESTRIL Stopped by: Scott C Stoioff, MD     TAKE these medications   DEPLIN PO Take by mouth.   FLUoxetine 10 MG tablet Commonly known  as: PROZAC Take 30 mg by mouth daily. What changed: Another medication with the same name was removed. Continue taking this medication, and follow the directions you see here. Changed by: Scott C Stoioff, MD   lamoTRIgine 100 MG tablet Commonly known as: LAMICTAL Take 300 mg by mouth daily. What changed: Another medication with the same name was removed. Continue taking this medication, and follow the directions you see here. Changed by: Scott C Stoioff, MD   simvastatin 20 MG tablet Commonly known as: ZOCOR Take 20 mg by mouth daily.   telmisartan 40 MG tablet Commonly known as: MICARDIS TAKE 1 TABLET BY MOUTH EVERY DAY       Allergies:  Allergies  Allergen Reactions  . Other Other (See Comments)    Intolerance to eggs - "sinus drainage" Intolerance to eggs - "sinus drainage"     Family History: No family history on file.  Social History:  reports that he has never smoked. He has never used smokeless tobacco. He reports current alcohol use. He reports that he does not use drugs.  ROS: UROLOGY Frequent Urination?: No Hard to postpone urination?: No Burning/pain with urination?: No Get up at night to urinate?: Yes Leakage of urine?: No Urine stream starts and stops?: No Trouble starting stream?: No Do you have to strain to urinate?: No Blood in urine?: Yes Urinary

## 2019-02-01 LAB — URINALYSIS, COMPLETE
Bilirubin, UA: NEGATIVE
Glucose, UA: NEGATIVE
Ketones, UA: NEGATIVE
Leukocytes,UA: NEGATIVE
Nitrite, UA: NEGATIVE
Protein,UA: NEGATIVE
Specific Gravity, UA: 1.02 (ref 1.005–1.030)
Urobilinogen, Ur: 0.2 mg/dL (ref 0.2–1.0)
pH, UA: 5.5 (ref 5.0–7.5)

## 2019-02-01 LAB — MICROSCOPIC EXAMINATION
Bacteria, UA: NONE SEEN
Epithelial Cells (non renal): NONE SEEN /hpf (ref 0–10)
RBC, Urine: NONE SEEN /hpf (ref 0–2)

## 2019-02-07 ENCOUNTER — Encounter: Payer: Self-pay | Admitting: Urology

## 2019-02-07 ENCOUNTER — Other Ambulatory Visit: Payer: 59

## 2019-02-07 ENCOUNTER — Telehealth: Payer: Self-pay | Admitting: Urology

## 2019-02-07 NOTE — Telephone Encounter (Signed)
Pt LMOM and would like a call back about his labs, he has questions. Please advise.

## 2019-02-07 NOTE — Telephone Encounter (Signed)
Spoke to patient about his urine results. Patient understands the result now.

## 2019-02-28 ENCOUNTER — Other Ambulatory Visit: Payer: 59

## 2019-03-01 ENCOUNTER — Ambulatory Visit: Payer: 59 | Admitting: Urology

## 2019-03-20 ENCOUNTER — Other Ambulatory Visit: Payer: 59

## 2019-04-14 ENCOUNTER — Ambulatory Visit
Admission: RE | Admit: 2019-04-14 | Discharge: 2019-04-14 | Disposition: A | Discharge status: Home / Self Care | Payer: 59 | Source: Ambulatory Visit | Attending: Nephrology | Admitting: Nephrology

## 2019-04-14 DIAGNOSIS — N183 Chronic kidney disease, stage 3 unspecified: Secondary | ICD-10-CM

## 2019-06-27 DIAGNOSIS — D239 Other benign neoplasm of skin, unspecified: Secondary | ICD-10-CM

## 2019-06-27 HISTORY — DX: Other benign neoplasm of skin, unspecified: D23.9

## 2019-09-05 DIAGNOSIS — D229 Melanocytic nevi, unspecified: Secondary | ICD-10-CM

## 2019-09-05 HISTORY — DX: Melanocytic nevi, unspecified: D22.9

## 2019-12-04 ENCOUNTER — Other Ambulatory Visit: Payer: Self-pay

## 2019-12-04 ENCOUNTER — Encounter: Payer: Self-pay | Admitting: Dermatology

## 2019-12-04 ENCOUNTER — Ambulatory Visit (INDEPENDENT_AMBULATORY_CARE_PROVIDER_SITE_OTHER): Payer: 59 | Admitting: Dermatology

## 2019-12-04 DIAGNOSIS — Z86018 Personal history of other benign neoplasm: Secondary | ICD-10-CM | POA: Diagnosis not present

## 2019-12-04 DIAGNOSIS — Z1283 Encounter for screening for malignant neoplasm of skin: Secondary | ICD-10-CM

## 2019-12-04 DIAGNOSIS — L918 Other hypertrophic disorders of the skin: Secondary | ICD-10-CM

## 2019-12-04 DIAGNOSIS — D235 Other benign neoplasm of skin of trunk: Secondary | ICD-10-CM

## 2019-12-04 DIAGNOSIS — L814 Other melanin hyperpigmentation: Secondary | ICD-10-CM

## 2019-12-04 DIAGNOSIS — L578 Other skin changes due to chronic exposure to nonionizing radiation: Secondary | ICD-10-CM

## 2019-12-04 DIAGNOSIS — D229 Melanocytic nevi, unspecified: Secondary | ICD-10-CM

## 2019-12-04 DIAGNOSIS — K429 Umbilical hernia without obstruction or gangrene: Secondary | ICD-10-CM

## 2019-12-04 DIAGNOSIS — D239 Other benign neoplasm of skin, unspecified: Secondary | ICD-10-CM

## 2019-12-04 DIAGNOSIS — L821 Other seborrheic keratosis: Secondary | ICD-10-CM

## 2019-12-04 DIAGNOSIS — D1800 Hemangioma unspecified site: Secondary | ICD-10-CM

## 2019-12-04 NOTE — Progress Notes (Signed)
Follow-Up Visit   Subjective  Blake Strickland is a 59 y.o. male who presents for the following: Annual Exam (Hx of severe dysplastic nevus - patient has noticed new lesions that are changing on the R inner thigh (about 2 months)  and R forearm (a scar from a long time ago but coloration has changed). ). Patient presents for skin cancer screening, mole check and total body skin exam.  The following portions of the chart were reviewed this encounter and updated as appropriate: Tobacco  Allergies  Meds  Problems  Med Hx  Surg Hx  Fam Hx     Review of Systems: No other skin or systemic complaints.  Objective  Well appearing patient in no apparent distress; mood and affect are within normal limits.  A full examination was performed including scalp, head, eyes, ears, nose, lips, neck, chest, axillae, abdomen, back, buttocks, bilateral upper extremities, bilateral lower extremities, hands, feet, fingers, toes, fingernails, and toenails. All findings within normal limits unless otherwise noted below.  Objective  R thigh near the groin:    Objective  R lat clavicle: Scar with no evidence of recurrence.   Objective  umbilical: Herniation   Assessment & Plan  Dermatofibroma R thigh near the groin  Benign, observe.    History of dysplastic nevus R lat clavicle  Clear. Observe for recurrence. Call clinic for new or changing lesions.  Recommend regular skin exams, daily broad-spectrum spf 30+ sunscreen use, and photoprotection.     Umbilical hernia without obstruction and without gangrene umbilical Discuss with PCP Observe    Seborrheic Keratoses - Stuck-on, waxy, tan-brown papules and plaques  - Discussed benign etiology and prognosis. - Observe - Call for any changes  Actinic Damage - diffuse scaly erythematous macules with underlying dyspigmentation - Recommend daily broad spectrum sunscreen SPF 30+ to sun-exposed areas, reapply every 2 hours as needed.  - Call for  new or changing lesions.  Lentigines - Scattered tan macules - Discussed due to sun exposure - Benign, observe - Call for any changes  Hemangiomas - Red papules - Discussed benign nature - Observe - Call for any changes  Melanocytic Nevi - Tan-brown and/or pink-flesh-colored symmetric macules and papules - Benign appearing on exam today - Observation - Call clinic for new or changing moles - Recommend daily use of broad spectrum spf 30+ sunscreen to sun-exposed areas.   Acrochordons (Skin Tags) - Fleshy, skin-colored pedunculated papules - Benign appearing.  - Observe. - If desired, they can be removed with an in office procedure that is not covered by insurance. - Please call the clinic if you notice any new or changing lesions.  Procedure: Skin tag removal Informed consent:  Discussed risks (permanent scarring, infection, pain, bleeding, bruising, redness, and recurrence of the lesion) and benefits of the procedure, as well as the alternatives.  He is aware that skin tags are benign lesions, and their removal is often not considered medically necessary.  Informed consent was obtained. Anesthesia: Lidocaine with epinephrine  The area was prepared in a standard fashion. Snip removal was performed.   Antibiotic ointment and a sterile dressing were applied.   The patient tolerated procedure well. The patient was instructed on post-op care.   Number of lesions removed:  2 of the L axilla   Skin cancer screening performed today.  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   Luther Redo, CMA, am acting as scribe for Sarina Ser, MD .  Documentation: I have reviewed the above documentation for accuracy and completeness, and I agree with the above.  Sarina Ser, MD

## 2019-12-05 ENCOUNTER — Encounter: Payer: Self-pay | Admitting: Dermatology

## 2020-01-31 ENCOUNTER — Ambulatory Visit: Payer: 59 | Admitting: Urology

## 2020-03-28 ENCOUNTER — Ambulatory Visit: Payer: BC Managed Care – PPO | Admitting: Urology

## 2020-03-28 ENCOUNTER — Other Ambulatory Visit: Payer: Self-pay

## 2020-03-28 ENCOUNTER — Encounter: Payer: Self-pay | Admitting: Urology

## 2020-03-28 VITALS — BP 128/99 | HR 88 | Temp 98.0°F | Ht 72.0 in | Wt 220.0 lb

## 2020-03-28 DIAGNOSIS — N401 Enlarged prostate with lower urinary tract symptoms: Secondary | ICD-10-CM | POA: Diagnosis not present

## 2020-03-28 DIAGNOSIS — R350 Frequency of micturition: Secondary | ICD-10-CM

## 2020-03-28 LAB — BLADDER SCAN AMB NON-IMAGING: Scan Result: 174

## 2020-03-28 NOTE — Progress Notes (Signed)
03/28/2020 11:10 AM   Blake Strickland 09-Jul-1961 [id number]  Referring provider: [name], MD [address] [place],  [place] [address]  Chief Complaint  Patient presents with  . Benign Prostatic Hypertrophy    Urologic history: 1.  BPH with LUTS  2.  Hypogonadism -Previously on testosterone and Clomid   HPI: 59 y.o. male presents for annual follow-up.   Last visit 01/31/2019  Feels lower urinary tract symptoms are stable.  Does have intermittent urinary stream, nocturia x2 and sensation incomplete emptying  Denies dysuria, gross hematuria  Denies flank, abdominal or pelvic pain  PSA 09/05/2018 0.21    PMH: Past Medical History:  Diagnosis Date  . Achilles tendonitis   . Asthma   . Atypical mole 09/05/2019   R lat clavicle   . Elevated cholesterol   . Hypertension     Surgical History: Past Surgical History:  Procedure Laterality Date  . COLONOSCOPY  2016  . NOSE SURGERY      Home Medications:  Allergies as of 03/28/2020      Reactions   Other Other (See Comments)   Intolerance to eggs - "sinus drainage" Intolerance to eggs - "sinus drainage"      Medication List       Accurate as of March 28, 2020 11:10 AM. If you have any questions, ask your nurse or doctor.        DEPLIN PO Take by mouth.   FLUoxetine 10 MG tablet Commonly known as: PROZAC Take 30 mg by mouth daily.   lamoTRIgine 100 MG tablet Commonly known as: LAMICTAL Take 300 mg by mouth daily.   One Daily tablet Take by mouth.   simvastatin 20 MG tablet Commonly known as: ZOCOR Take 20 mg by mouth daily.   telmisartan 40 MG tablet Commonly known as: MICARDIS TAKE 1 TABLET BY MOUTH EVERY DAY       Allergies:  Allergies  Allergen Reactions  . Other Other (See Comments)    Intolerance to eggs - "sinus drainage" Intolerance to eggs - "sinus drainage"     Family History: No family history on file.  Social History:  reports that he has  never smoked. He has never used smokeless tobacco. He reports current alcohol use. He reports that he does not use drugs.   Physical Exam: There were no vitals taken for this visit.  Constitutional:  Alert and oriented, No acute distress. HEENT: [place] AT, moist mucus membranes.  Trachea midline, no masses. Cardiovascular: No clubbing, cyanosis, or edema. Respiratory: Normal respiratory effort, no increased work of breathing. Skin: No rashes, bruises or suspicious lesions. Neurologic: Grossly intact, no focal deficits, moving all 4 extremities. Psychiatric: Normal mood and affect.   Assessment & Plan:    1. Benign prostatic hyperplasia with LUTS  Bladder scan PVR today 174 mL  We discussed this is a moderate residual, he is not on medical management and would recommend starting to see if his emptying can be improved.  Rx tamsulosin sent to pharmacy.  We discussed potential side effects including lightheadedness and ejaculatory dysfunction.  Follow-up 4-6 weeks for IPSS and repeat PVR  2.  Incomplete bladder emptying  As above   Blake Altes, MD  [facility] [address] [place], [place] [address] [phone]

## 2020-03-29 ENCOUNTER — Encounter: Payer: Self-pay | Admitting: Urology

## 2020-03-29 MED ORDER — TAMSULOSIN HCL 0.4 MG PO CAPS: 0.4000 mg | ORAL_CAPSULE | Freq: Every day | ORAL | 3 refills | Status: DC

## 2020-05-06 ENCOUNTER — Ambulatory Visit: Payer: Self-pay | Admitting: Urology

## 2020-06-13 ENCOUNTER — Ambulatory Visit: Payer: Self-pay | Admitting: Urology

## 2020-06-19 ENCOUNTER — Encounter: Payer: 59 | Admitting: Dermatology

## 2020-10-26 IMAGING — US US RENAL
1 series · 14 of 25 positions shown · non-contrast
Comparison: None.

CLINICAL DATA: Chronic kidney disease, stage III.

EXAM:
RENAL / URINARY TRACT ULTRASOUND COMPLETE

[Series 1: us renal · 0.26mm/px · 14 of 32 slices shown]
[im 1/32]
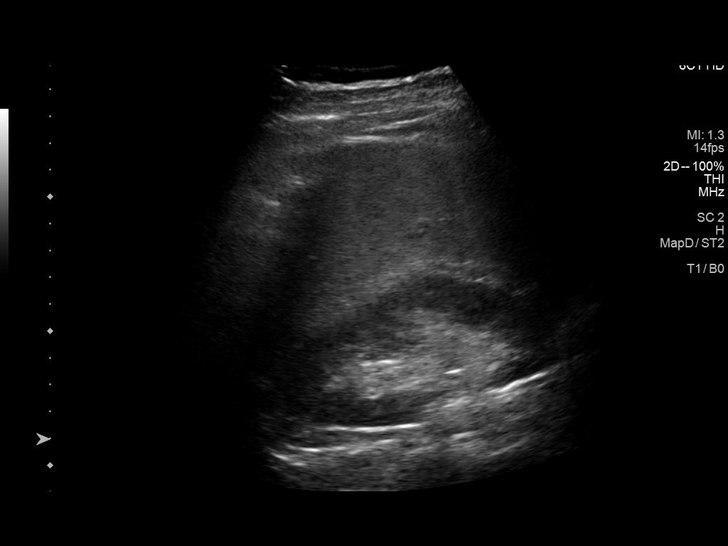
[im 3/32]
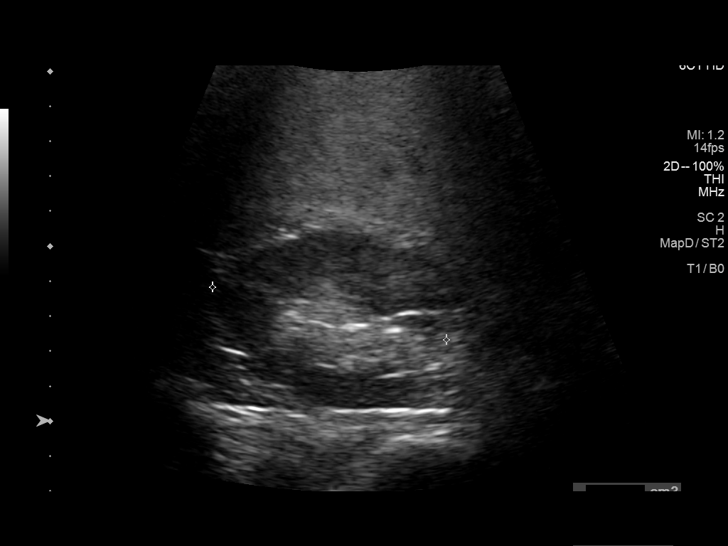
[im 6/32]
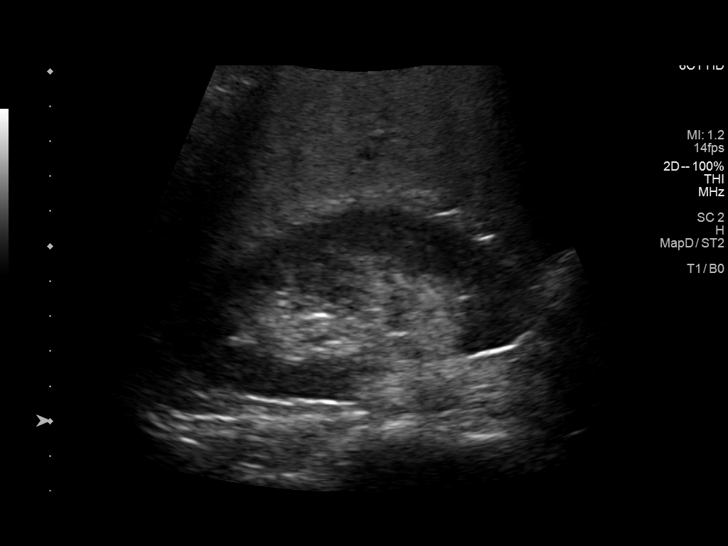
[im 8/32]
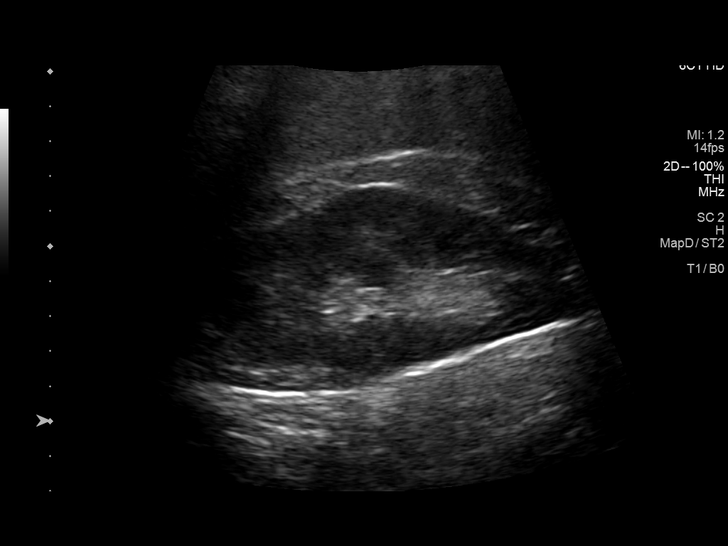
[im 11/32]
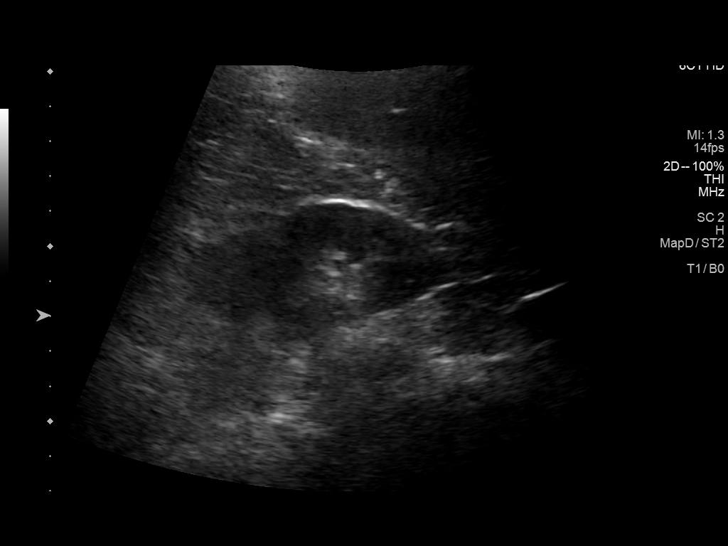
[im 12/32]
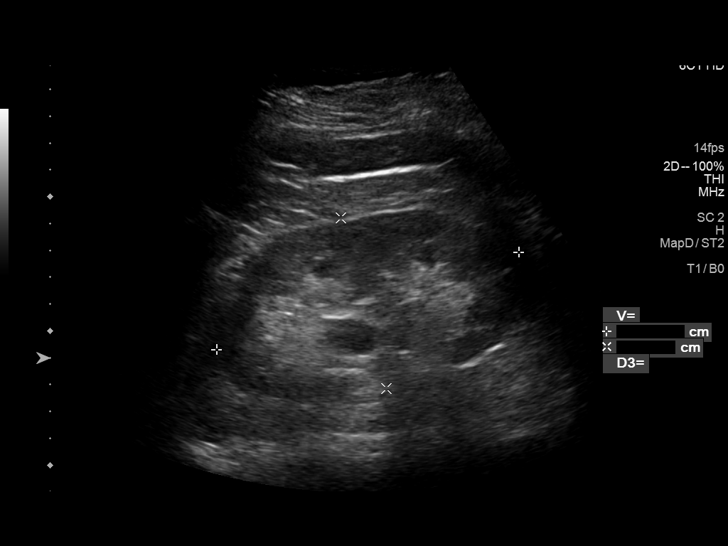
[im 15/32]
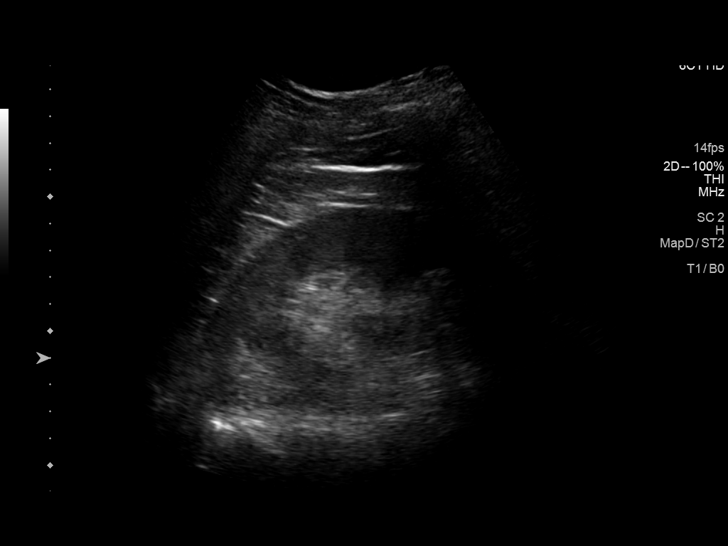
[im 17/32]
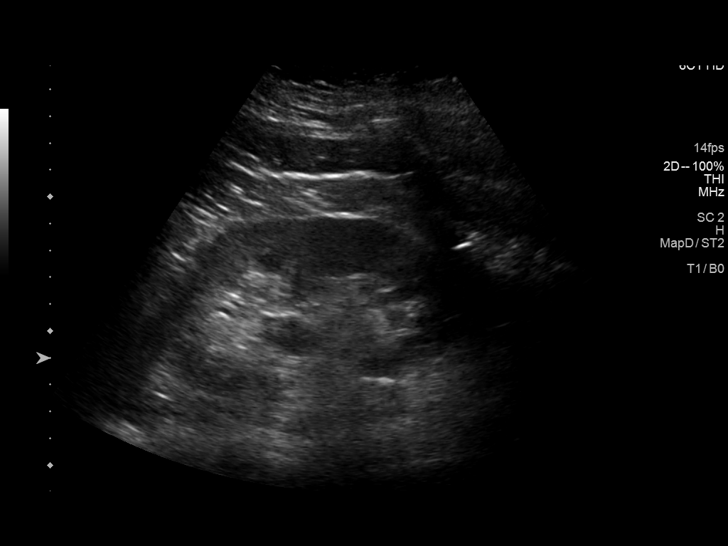
[im 20/32]
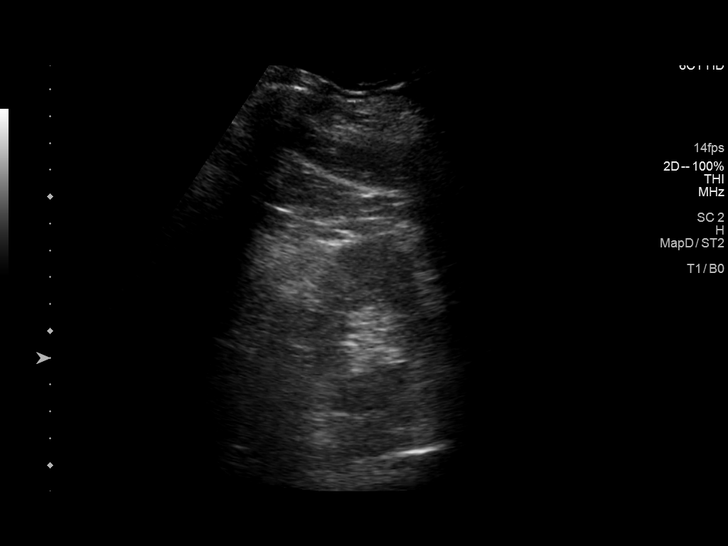
[im 21/32]
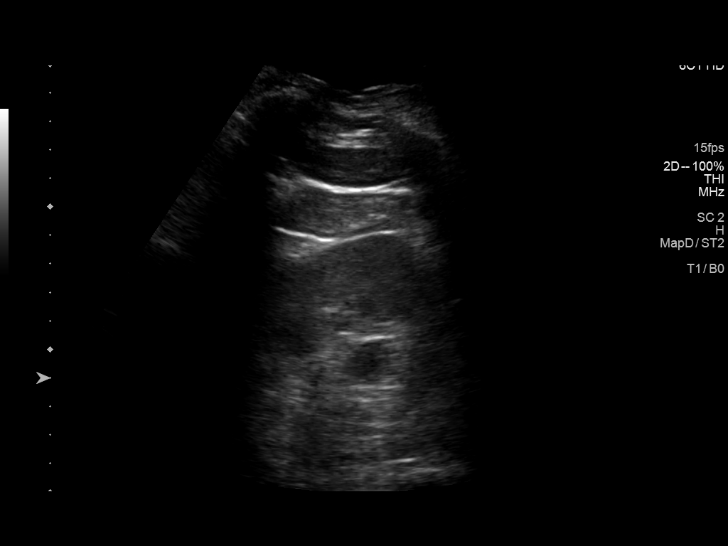
[im 24/32]
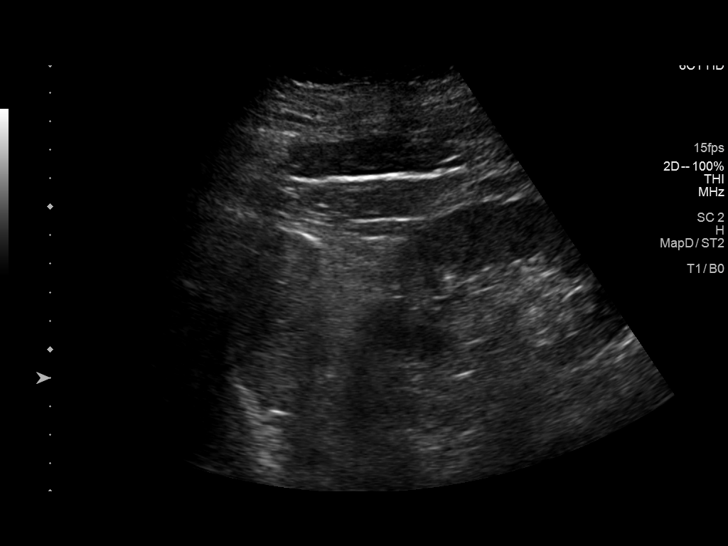
[im 26/32]
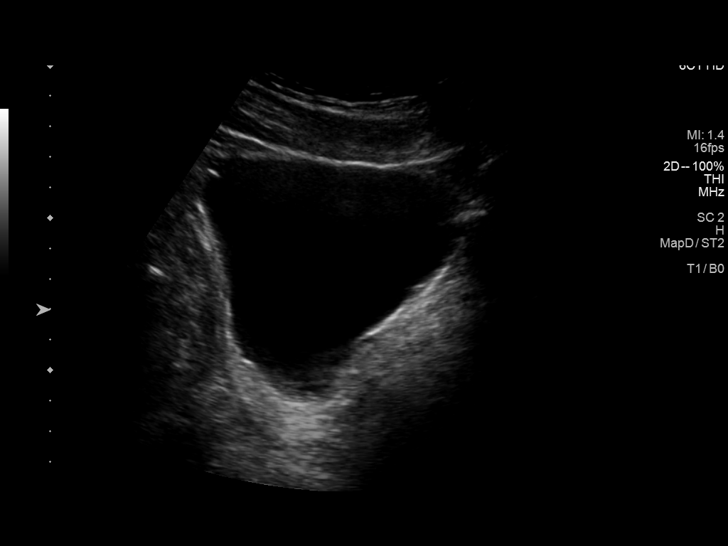
[im 29/32]
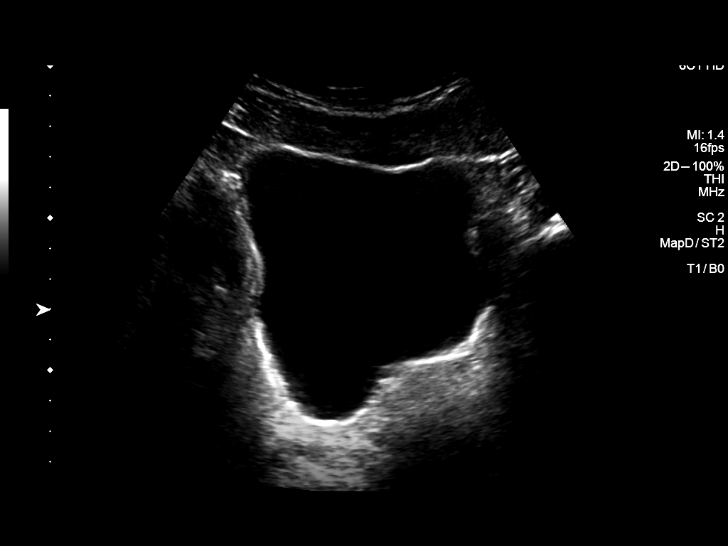
[im 32/32]
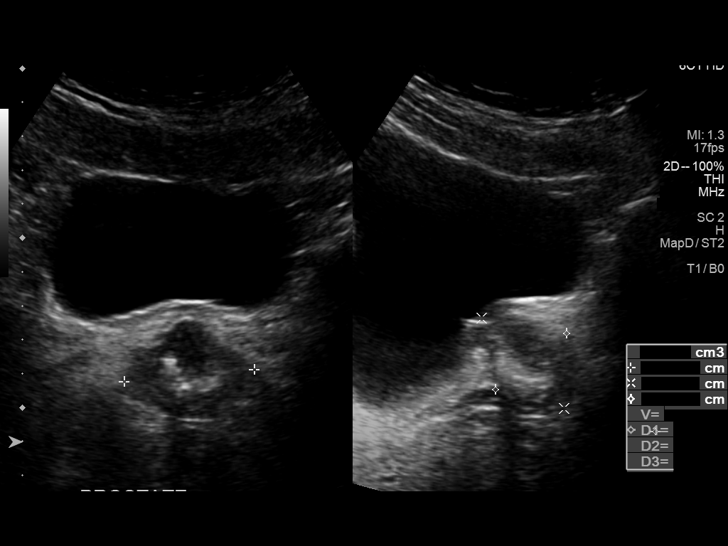

[14 of 25 positions shown; findings below may reference images not displayed]

FINDINGS: Right Kidney:

Renal measurements: 11.5 x 5.7 x 7.0 cm = volume: 234 mL .
Echogenicity within normal limits. No mass or hydronephrosis
visualized.

Left Kidney:

Renal measurements: 11.8 x 6.6 x 7.1 cm = volume: 290 mL.
Echogenicity within normal limits. No solid mass or hydronephrosis
visualized. Simple cyst of the parapelvic region measures
approximately 1.9 cm in greatest diameter.

Bladder:

Appears normal for degree of bladder distention.
IMPRESSION: Unremarkable renal sonogram.

## 2020-12-02 ENCOUNTER — Ambulatory Visit: Payer: 59 | Admitting: Dermatology

## 2020-12-09 ENCOUNTER — Other Ambulatory Visit: Payer: Self-pay

## 2020-12-09 ENCOUNTER — Ambulatory Visit: Payer: BC Managed Care – PPO | Admitting: Dermatology

## 2020-12-09 ENCOUNTER — Encounter: Payer: Self-pay | Admitting: Dermatology

## 2020-12-09 DIAGNOSIS — D229 Melanocytic nevi, unspecified: Secondary | ICD-10-CM

## 2020-12-09 DIAGNOSIS — L820 Inflamed seborrheic keratosis: Secondary | ICD-10-CM

## 2020-12-09 DIAGNOSIS — L578 Other skin changes due to chronic exposure to nonionizing radiation: Secondary | ICD-10-CM

## 2020-12-09 DIAGNOSIS — K429 Umbilical hernia without obstruction or gangrene: Secondary | ICD-10-CM | POA: Diagnosis not present

## 2020-12-09 DIAGNOSIS — Z1283 Encounter for screening for malignant neoplasm of skin: Secondary | ICD-10-CM | POA: Diagnosis not present

## 2020-12-09 DIAGNOSIS — L814 Other melanin hyperpigmentation: Secondary | ICD-10-CM | POA: Diagnosis not present

## 2020-12-09 DIAGNOSIS — L821 Other seborrheic keratosis: Secondary | ICD-10-CM

## 2020-12-09 DIAGNOSIS — D1800 Hemangioma unspecified site: Secondary | ICD-10-CM

## 2020-12-09 NOTE — Patient Instructions (Addendum)

## 2020-12-09 NOTE — Progress Notes (Signed)
   Follow-Up Visit   Subjective  Blake Strickland is a 60 y.o. male who presents for the following: Annual Exam. Yearly  Mole check, hx of Dysplastic nevus. Pt c/o spot on chest changing color.  The patient presents for Total-Body Skin Exam (TBSE) for skin cancer screening and mole check.  The following portions of the chart were reviewed this encounter and updated as appropriate:   Tobacco  Allergies  Meds  Problems  Med Hx  Surg Hx  Fam Hx     Review of Systems:  No other skin or systemic complaints except as noted in HPI or Assessment and Plan.  Objective  Well appearing patient in no apparent distress; mood and affect are within normal limits.  A full examination was performed including scalp, head, eyes, ears, nose, lips, neck, chest, axillae, abdomen, back, buttocks, bilateral upper extremities, bilateral lower extremities, hands, feet, fingers, toes, fingernails, and toenails. All findings within normal limits unless otherwise noted below.  Objective  Right infrapectoral x 1: Erythematous keratotic or waxy stuck-on papule or plaque.   Objective  umbilical: Umbilical hernia   Assessment & Plan  Inflamed seborrheic keratosis Right infrapectoral x 1 Destruction of lesion - Right infrapectoral x 1 Complexity: simple   Destruction method: cryotherapy   Informed consent: discussed and consent obtained   Timeout:  patient name, date of birth, surgical site, and procedure verified Lesion destroyed using liquid nitrogen: Yes   Region frozen until ice ball extended beyond lesion: Yes   Outcome: patient tolerated procedure well with no complications   Post-procedure details: wound care instructions given    Umbilical hernia without obstruction  umbilical Pt to consult his PCP about this.  Skin cancer screening   Lentigines - Scattered tan macules - Due to sun exposure - Benign-appering, observe - Recommend daily broad spectrum sunscreen SPF 30+ to sun-exposed areas,  reapply every 2 hours as needed. - Call for any changes  Seborrheic Keratoses - Stuck-on, waxy, tan-brown papules and/or plaques  - Benign-appearing - Discussed benign etiology and prognosis. - Observe - Call for any changes  Melanocytic Nevi - Tan-brown and/or pink-flesh-colored symmetric macules and papules - Benign appearing on exam today - Observation - Call clinic for new or changing moles - Recommend daily use of broad spectrum spf 30+ sunscreen to sun-exposed areas.   Hemangiomas - Red papules - Discussed benign nature - Observe - Call for any changes  Actinic Damage - Chronic condition, secondary to cumulative UV/sun exposure - diffuse scaly erythematous macules with underlying dyspigmentation - Recommend daily broad spectrum sunscreen SPF 30+ to sun-exposed areas, reapply every 2 hours as needed.  - Staying in the shade or wearing long sleeves, sun glasses (UVA+UVB protection) and wide brim hats (4-inch brim around the entire circumference of the hat) are also recommended for sun protection.  - Call for new or changing lesions.  Skin cancer screening performed today.  Return in about 1 year (around 12/09/2021) for TBSE.  IMarye Round, CMA, am acting as scribe for Sarina Ser, MD .  Documentation: I have reviewed the above documentation for accuracy and completeness, and I agree with the above.  Sarina Ser, MD

## 2020-12-10 ENCOUNTER — Encounter: Payer: Self-pay | Admitting: Dermatology

## 2021-02-05 ENCOUNTER — Ambulatory Visit: Payer: BC Managed Care – PPO | Admitting: Podiatry

## 2021-02-05 ENCOUNTER — Encounter: Payer: Self-pay | Admitting: Podiatry

## 2021-02-05 ENCOUNTER — Other Ambulatory Visit: Payer: Self-pay

## 2021-02-05 DIAGNOSIS — S90851A Superficial foreign body, right foot, initial encounter: Secondary | ICD-10-CM

## 2021-02-05 DIAGNOSIS — L089 Local infection of the skin and subcutaneous tissue, unspecified: Secondary | ICD-10-CM

## 2021-02-06 ENCOUNTER — Encounter: Payer: Self-pay | Admitting: Podiatry

## 2021-02-06 NOTE — Progress Notes (Signed)
Subjective:  Patient ID: XADEN RITZMAN, male    DOB: [birth year],  MRN: [id number]  Chief Complaint  Patient presents with   Callouses    Patient says he stepped on something 2 weeks ago, was able to pick out a splinter or something, but his right forefoot is still sore when he walks.  [facility] and was given Doxycycline    60 y.o. male presents with the above complaint. History confirmed with patient.   Objective:  Physical Exam: warm, good capillary refill, no trophic changes or ulcerative lesions, normal DP and PT pulses, and normal sensory exam.  Right Foot: Punctate puncture wound  Assessment:   1. Splinter of foot without major open wound with infection, right, initial encounter      Plan:  Patient was evaluated and treated and all questions answered.  After debridement of the overlying callus with a chisel blade I was able to express the remainder of the splinter.  No deep wound or worsening signs of infection were noted.  Continue the doxycycline until completion.  Did not take x-ray as was able to get the remainder of the splinter and no retained foreign body was detected in the wound.  Return as needed if does not improve or worsens.  Return if symptoms worsen or fail to improve.

## 2021-02-24 ENCOUNTER — Other Ambulatory Visit: Payer: Self-pay | Admitting: Internal Medicine

## 2021-02-24 DIAGNOSIS — R0602 Shortness of breath: Secondary | ICD-10-CM

## 2021-02-24 DIAGNOSIS — R9431 Abnormal electrocardiogram [ECG] [EKG]: Secondary | ICD-10-CM

## 2021-03-04 ENCOUNTER — Other Ambulatory Visit: Payer: Self-pay

## 2021-03-04 ENCOUNTER — Ambulatory Visit
Admission: RE | Admit: 2021-03-04 | Discharge: 2021-03-04 | Disposition: A | Discharge status: Home / Self Care | Payer: BC Managed Care – PPO | Source: Ambulatory Visit | Attending: Internal Medicine | Admitting: Internal Medicine

## 2021-03-04 DIAGNOSIS — R0602 Shortness of breath: Secondary | ICD-10-CM | POA: Insufficient documentation

## 2021-03-04 DIAGNOSIS — R9431 Abnormal electrocardiogram [ECG] [EKG]: Secondary | ICD-10-CM

## 2021-03-31 ENCOUNTER — Other Ambulatory Visit: Payer: Self-pay | Admitting: Urology

## 2021-06-06 ENCOUNTER — Other Ambulatory Visit: Payer: BC Managed Care – PPO

## 2021-06-13 ENCOUNTER — Ambulatory Visit: Admit: 2021-06-13 | Payer: BC Managed Care – PPO | Admitting: General Surgery

## 2021-06-13 SURGERY — REPAIR, HERNIA, UMBILICAL, ADULT
Anesthesia: General

## 2021-07-09 ENCOUNTER — Ambulatory Visit: Payer: BC Managed Care – PPO | Admitting: Urology

## 2021-07-17 ENCOUNTER — Ambulatory Visit: Payer: BC Managed Care – PPO | Admitting: Urology

## 2021-09-17 ENCOUNTER — Encounter: Payer: Self-pay | Admitting: Urology

## 2021-09-17 ENCOUNTER — Other Ambulatory Visit: Payer: Self-pay

## 2021-09-17 ENCOUNTER — Ambulatory Visit: Payer: BC Managed Care – PPO | Admitting: Urology

## 2021-09-17 VITALS — BP 138/88 | HR 80 | Ht 72.0 in | Wt 222.0 lb

## 2021-09-17 DIAGNOSIS — R339 Retention of urine, unspecified: Secondary | ICD-10-CM | POA: Diagnosis not present

## 2021-09-17 DIAGNOSIS — N401 Enlarged prostate with lower urinary tract symptoms: Secondary | ICD-10-CM

## 2021-09-17 DIAGNOSIS — R350 Frequency of micturition: Secondary | ICD-10-CM

## 2021-09-17 LAB — BLADDER SCAN AMB NON-IMAGING: Scan Result: 212

## 2021-09-17 LAB — URINALYSIS, COMPLETE
Bilirubin, UA: NEGATIVE
Glucose, UA: NEGATIVE
Ketones, UA: NEGATIVE
Leukocytes,UA: NEGATIVE
Nitrite, UA: NEGATIVE
Protein,UA: NEGATIVE
RBC, UA: NEGATIVE
Specific Gravity, UA: 1.01 (ref 1.005–1.030)
Urobilinogen, Ur: 0.2 mg/dL (ref 0.2–1.0)
pH, UA: 6 (ref 5.0–7.5)

## 2021-09-17 LAB — MICROSCOPIC EXAMINATION
Bacteria, UA: NONE SEEN
Epithelial Cells (non renal): NONE SEEN /hpf (ref 0–10)

## 2021-09-17 MED ORDER — ALFUZOSIN HCL ER 10 MG PO TB24: 10.0000 mg | ORAL_TABLET | Freq: Every day | ORAL | 3 refills | Status: DC

## 2021-09-17 NOTE — Progress Notes (Signed)
09/17/2021 10:24 AM   Blake Strickland 07/15/1961 [id number]  Referring provider: [name], MD [address] [place],  [place] [address]  Chief Complaint  Patient presents with   Benign Prostatic Hypertrophy    Urologic history: 1.  BPH with LUTS   2.  Hypogonadism -Previously on testosterone and Clomid  HPI: 61 y.o. male presents for follow-up.  Last office visit 03/2020 IPSS at that visit 14/35 and PVR was 174 mL Given a trial of tamsulosin and 4-6-week follow-up was recommended for recheck PVR He took the medication for 1 month and complained of dryness of the mouth and elected not to continue IPSS today 19/35 with most bothersome symptoms including sensation incomplete emptying, intermittent urinary stream and weak urinary stream No dysuria or gross hematuria No flank, abdominal or pelvic pain PSA 06/2021 0.22     PMH: Past Medical History:  Diagnosis Date   Achilles tendonitis    Asthma    Atypical mole 09/05/2019   R lat clavicle    Dysplastic nevus 06/27/2019   right lateral clavicle dysplastic junctional lentiginous nevus with severe atypia irritated limited margins free   Elevated cholesterol    Hypertension     Surgical History: Past Surgical History:  Procedure Laterality Date   COLONOSCOPY  2016   NOSE SURGERY      Home Medications:  Allergies as of 09/17/2021       Reactions   Other Other (See Comments)   Intolerance to eggs - "sinus drainage" Intolerance to eggs - "sinus drainage"        Medication List        Accurate as of September 17, 2021 10:24 AM. If you have any questions, ask your nurse or doctor.          DEPLIN PO Take by mouth.   doxycycline 100 MG capsule Commonly known as: VIBRAMYCIN Take 100 mg by mouth 2 (two) times daily.   eszopiclone 2 MG Tabs tablet Commonly known as: LUNESTA Take 2 mg by mouth at bedtime.   lamoTRIgine 100 MG tablet Commonly known as: LAMICTAL Take by  mouth.   One Daily tablet Take by mouth.   rosuvastatin 10 MG tablet Commonly known as: CRESTOR   telmisartan-hydrochlorothiazide 80-12.5 MG tablet Commonly known as: MICARDIS HCT Take 1 tablet by mouth daily.   Trintellix 10 MG Tabs tablet Generic drug: vortioxetine HBr Take 10 mg by mouth daily.        Allergies:  Allergies  Allergen Reactions   Other Other (See Comments)    Intolerance to eggs - "sinus drainage" Intolerance to eggs - "sinus drainage"     Family History: No family history on file.  Social History:  reports that he has never smoked. He has never used smokeless tobacco. He reports current alcohol use. He reports that he does not use drugs.   Physical Exam: BP 138/88    Pulse 80    Ht 6' (1.829 m)    Wt 222 lb (100.7 kg)    BMI 30.11 kg/m   Constitutional:  Alert and oriented, No acute distress. HEENT: [place] AT, moist mucus membranes.  Trachea midline, no masses. Cardiovascular: No clubbing, cyanosis, or edema. Respiratory: Normal respiratory effort, no increased work of breathing. Psychiatric: Normal mood and affect.  Laboratory Data:  Urinalysis Dipstick/microscopy negative   Assessment & Plan:    1. Benign prostatic hyperplasia with LUTS Worsening lower urinary tract symptoms Was interested in medical management; bothersome side effects  with tamsulosin and Rx alfuzosin sent to pharmacy Follow-up 6 weeks for repeat bladder scan  2.  Incomplete bladder emptying As above    [name], MD  [facility] [address], [address] [place], [place] [address] [phone]

## 2021-09-18 ENCOUNTER — Encounter: Payer: Self-pay | Admitting: Urology

## 2021-09-25 ENCOUNTER — Encounter: Payer: Self-pay | Admitting: Urology

## 2021-10-21 ENCOUNTER — Ambulatory Visit: Payer: BC Managed Care – PPO

## 2021-12-11 ENCOUNTER — Ambulatory Visit: Payer: BC Managed Care – PPO | Admitting: Dermatology

## 2021-12-18 ENCOUNTER — Ambulatory Visit: Payer: BC Managed Care – PPO | Admitting: Physician Assistant

## 2021-12-21 ENCOUNTER — Telehealth: Payer: Self-pay | Admitting: Urology

## 2021-12-21 NOTE — Telephone Encounter (Signed)
Cancelled appt for f/u PVR with Sam.  Recc r/s ?

## 2022-05-15 ENCOUNTER — Encounter: Payer: Self-pay | Admitting: Urology

## 2022-05-20 ENCOUNTER — Ambulatory Visit: Payer: BC Managed Care – PPO | Admitting: Dermatology

## 2022-05-20 DIAGNOSIS — L814 Other melanin hyperpigmentation: Secondary | ICD-10-CM | POA: Diagnosis not present

## 2022-05-20 DIAGNOSIS — D1801 Hemangioma of skin and subcutaneous tissue: Secondary | ICD-10-CM

## 2022-05-20 DIAGNOSIS — Z1283 Encounter for screening for malignant neoplasm of skin: Secondary | ICD-10-CM

## 2022-05-20 DIAGNOSIS — D229 Melanocytic nevi, unspecified: Secondary | ICD-10-CM

## 2022-05-20 DIAGNOSIS — L578 Other skin changes due to chronic exposure to nonionizing radiation: Secondary | ICD-10-CM | POA: Diagnosis not present

## 2022-05-20 DIAGNOSIS — L820 Inflamed seborrheic keratosis: Secondary | ICD-10-CM | POA: Diagnosis not present

## 2022-05-20 DIAGNOSIS — L821 Other seborrheic keratosis: Secondary | ICD-10-CM

## 2022-05-20 DIAGNOSIS — Z86018 Personal history of other benign neoplasm: Secondary | ICD-10-CM

## 2022-05-20 DIAGNOSIS — D239 Other benign neoplasm of skin, unspecified: Secondary | ICD-10-CM

## 2022-05-20 NOTE — Progress Notes (Signed)
   Follow-Up Visit   Subjective  Blake Strickland is a 61 y.o. male who presents for the following: Annual Exam (History of dysplastic nevi - The patient presents for Total-Body Skin Exam (TBSE) for skin cancer screening and mole check.  The patient has spots, moles and lesions to be evaluated, some may be new or changing and the patient has concerns that these could be cancer./).  The following portions of the chart were reviewed this encounter and updated as appropriate:   Tobacco  Allergies  Meds  Problems  Med Hx  Surg Hx  Fam Hx     Review of Systems:  No other skin or systemic complaints except as noted in HPI or Assessment and Plan.  Objective  Well appearing patient in no apparent distress; mood and affect are within normal limits.  A full examination was performed including scalp, head, eyes, ears, nose, lips, neck, chest, axillae, abdomen, back, buttocks, bilateral upper extremities, bilateral lower extremities, hands, feet, fingers, toes, fingernails, and toenails. All findings within normal limits unless otherwise noted below.  Right volar forearm x 1, ant neck x 2 (3) Erythematous stuck-on, waxy papule or plaque   Assessment & Plan   History of Dysplastic Nevi - No evidence of recurrence today - Recommend regular full body skin exams - Recommend daily broad spectrum sunscreen SPF 30+ to sun-exposed areas, reapply every 2 hours as needed.  - Call if any new or changing lesions are noted between office visits  Lentigines - Scattered tan macules - Due to sun exposure - Benign-appearing, observe - Recommend daily broad spectrum sunscreen SPF 30+ to sun-exposed areas, reapply every 2 hours as needed. - Call for any changes  Seborrheic Keratoses - Stuck-on, waxy, tan-brown papules and/or plaques  - Benign-appearing - Discussed benign etiology and prognosis. - Observe - Call for any changes  Melanocytic Nevi - Tan-brown and/or pink-flesh-colored symmetric macules  and papules - Benign appearing on exam today - Observation - Call clinic for new or changing moles - Recommend daily use of broad spectrum spf 30+ sunscreen to sun-exposed areas.   Hemangiomas - Red papules - Discussed benign nature - Observe - Call for any changes  Actinic Damage - Chronic condition, secondary to cumulative UV/sun exposure - diffuse scaly erythematous macules with underlying dyspigmentation - Recommend daily broad spectrum sunscreen SPF 30+ to sun-exposed areas, reapply every 2 hours as needed.  - Staying in the shade or wearing long sleeves, sun glasses (UVA+UVB protection) and wide brim hats (4-inch brim around the entire circumference of the hat) are also recommended for sun protection.  - Call for new or changing lesions.  Skin cancer screening performed today.  Inflamed seborrheic keratosis (3) Right volar forearm x 1, ant neck x 2  Destruction of lesion - Right volar forearm x 1, ant neck x 2 Complexity: simple   Destruction method: cryotherapy   Informed consent: discussed and consent obtained   Timeout:  patient name, date of birth, surgical site, and procedure verified Lesion destroyed using liquid nitrogen: Yes   Region frozen until ice ball extended beyond lesion: Yes   Outcome: patient tolerated procedure well with no complications   Post-procedure details: wound care instructions given     Return in about 1 year (around 05/21/2023) for TBSE.  I, Ashok Cordia, CMA, am acting as scribe for Sarina Ser, MD . Documentation: I have reviewed the above documentation for accuracy and completeness, and I agree with the above.  Sarina Ser, MD

## 2022-05-20 NOTE — Patient Instructions (Signed)
Cryotherapy Aftercare  Wash gently with soap and water everyday.   Apply Vaseline and Band-Aid daily until healed.     Due to recent changes in healthcare laws, you may see results of your pathology and/or laboratory studies on MyChart before the doctors have had a chance to review them. We understand that in some cases there may be results that are confusing or concerning to you. Please understand that not all results are received at the same time and often the doctors may need to interpret multiple results in order to provide you with the best plan of care or course of treatment. Therefore, we ask that you please give us 2 business days to thoroughly review all your results before contacting the office for clarification. Should we see a critical lab result, you will be contacted sooner.   If You Need Anything After Your Visit  If you have any questions or concerns for your doctor, please call our main line at [phone] and press option 4 to reach your doctor's medical assistant. If no one answers, please leave a voicemail as directed and we will return your call as soon as possible. Messages left after 4 pm will be answered the following business day.   You may also send us a message via MyChart. We typically respond to MyChart messages within 1-2 business days.  For prescription refills, please ask your pharmacy to contact our office. Our fax number is [phone].  If you have an urgent issue when the clinic is closed that cannot wait until the next business day, you can page your doctor at the number below.    Please note that while we do our best to be available for urgent issues outside of office hours, we are not available 24/7.   If you have an urgent issue and are unable to reach us, you may choose to seek medical care at your doctor's office, retail clinic, urgent care center, or emergency room.  If you have a medical emergency, please immediately call 911 or go to the  emergency department.  Pager Numbers  - Dr. Kowalski: [phone]  - Dr. Moye: [phone]  - Dr. Stewart: [phone]  In the event of inclement weather, please call our main line at [phone] for an update on the status of any delays or closures.  Dermatology Medication Tips: Please keep the boxes that topical medications come in in order to help keep track of the instructions about where and how to use these. Pharmacies typically print the medication instructions only on the boxes and not directly on the medication tubes.   If your medication is too expensive, please contact our office at [phone] option 4 or send us a message through MyChart.   We are unable to tell what your co-pay for medications will be in advance as this is different depending on your insurance coverage. However, we may be able to find a substitute medication at lower cost or fill out paperwork to get insurance to cover a needed medication.   If a prior authorization is required to get your medication covered by your insurance company, please allow us 1-2 business days to complete this process.  Drug prices often vary depending on where the prescription is filled and some pharmacies may offer cheaper prices.  The website www.goodrx.com contains coupons for medications through different pharmacies. The prices here do not account for what the cost may be with help from insurance (it may be cheaper with your insurance), but the website can   give you the price if you did not use any insurance.  - You can print the associated coupon and take it with your prescription to the pharmacy.  - You may also stop by our office during regular business hours and pick up a GoodRx coupon card.  - If you need your prescription sent electronically to a different pharmacy, notify our office through [facility] MyChart or by phone at [phone] option 4.     Si Usted Necesita Algo Despus de Su Visita  Tambin puede  enviarnos un mensaje a travs de MyChart. Por lo general respondemos a los mensajes de MyChart en el transcurso de 1 a 2 das hbiles.  Para renovar recetas, por favor pida a su farmacia que se ponga en contacto con nuestra oficina. Nuestro nmero de fax es el [phone].  Si tiene un asunto urgente cuando la clnica est cerrada y que no puede esperar hasta el siguiente da hbil, puede llamar/localizar a su doctor(a) al nmero que aparece a continuacin.   Por favor, tenga en cuenta que aunque hacemos todo lo posible para estar disponibles para asuntos urgentes fuera del horario de oficina, no estamos disponibles las 24 horas del da, los 7 das de la semana.   Si tiene un problema urgente y no puede comunicarse con nosotros, puede optar por buscar atencin mdica  en el consultorio de su doctor(a), en una clnica privada, en un centro de atencin urgente o en una sala de emergencias.  Si tiene una emergencia mdica, por favor llame inmediatamente al 911 o vaya a la sala de emergencias.  Nmeros de bper  - Dr. Kowalski: [phone]  - Dra. Moye: [phone]  - Dra. Stewart: [phone]  En caso de inclemencias del tiempo, por favor llame a nuestra lnea principal al [phone] para una actualizacin sobre el estado de cualquier retraso o cierre.  Consejos para la medicacin en dermatologa: Por favor, guarde las cajas en las que vienen los medicamentos de uso tpico para ayudarle a seguir las instrucciones sobre dnde y cmo usarlos. Las farmacias generalmente imprimen las instrucciones del medicamento slo en las cajas y no directamente en los tubos del medicamento.   Si su medicamento es muy caro, por favor, pngase en contacto con nuestra oficina llamando al [phone] y presione la opcin 4 o envenos un mensaje a travs de MyChart.   No podemos decirle cul ser su copago por los medicamentos por adelantado ya que esto es diferente dependiendo de la cobertura de su seguro.  Sin embargo, es posible que podamos encontrar un medicamento sustituto a menor costo o llenar un formulario para que el seguro cubra el medicamento que se considera necesario.   Si se requiere una autorizacin previa para que su compaa de seguros cubra su medicamento, por favor permtanos de 1 a 2 das hbiles para completar este proceso.  Los precios de los medicamentos varan con frecuencia dependiendo del lugar de dnde se surte la receta y alguna farmacias pueden ofrecer precios ms baratos.  El sitio web www.goodrx.com tiene cupones para medicamentos de diferentes farmacias. Los precios aqu no tienen en cuenta lo que podra costar con la ayuda del seguro (puede ser ms barato con su seguro), pero el sitio web puede darle el precio si no utiliz ningn seguro.  - Puede imprimir el cupn correspondiente y llevarlo con su receta a la farmacia.  - Tambin puede pasar por nuestra oficina durante el horario de atencin regular y recoger una tarjeta de cupones de GoodRx.  -   Si necesita que su receta se enve electrnicamente a una farmacia diferente, informe a nuestra oficina a travs de MyChart de [facility] o por telfono llamando al [phone] y presione la opcin 4.  

## 2022-05-22 ENCOUNTER — Encounter: Payer: Self-pay | Admitting: Dermatology

## 2022-07-21 ENCOUNTER — Ambulatory Visit: Payer: Self-pay | Admitting: Surgery

## 2022-07-21 NOTE — H&P (Signed)
Subjective:   CC: Umbilical hernia with obstruction, without gangrene [K42.0]  HPI:  Gabrian Hoque is a 61 y.o. male who returns for evaluation of above. No major change since last seen by Dr. Bary Castilla.  Now ready to schedule surgery   Past Medical History:  has a past medical history of Allergic state, Asthma, Atypical mole, Broken nose (08/17/1981), Chickenpox, Depression, Diverticulosis (12/05/2014), Dysplastic nevus, Hypercholesteremia, Hypertension, Internal hemorrhoids (12/05/2014), and Stage 3 chronic kidney disease (CMS-HCC).  Past Surgical History:  Past Surgical History:  Procedure Laterality Date   COLONOSCOPY  12/05/2014   Diverticulosis/Internal hemorrhoids/Otherwise normal/Repeat 60yr (2026)/MGR   DESTRUCTION LOCALIZED LESION RETINA CRYOTHERAPY/DIATHERMY  12/09/2020   Dermatology (Sarina Ser MD)   nose surgery      Family History: family history includes Bipolar disorder in his father; Coronary Artery Disease (Blocked arteries around heart) in his father; Heart disease in his father; High blood pressure (Hypertension) in his mother and another family member; Stroke in an other family member; Stroke (age of onset: 673 in his mother; Throat cancer in his paternal grandfather.  Social History:  reports that he has never smoked. He has never used smokeless tobacco. He reports that he does not currently use alcohol. He reports that he does not use drugs.  Current Medications: has a current medication list which includes the following prescription(s): fluoxetine, lamotrigine, fluoxetine, lamotrigine, multivitamin, rosuvastatin, telmisartan-hydrochlorothiazide, and testosterone.  Allergies:  Allergies as of 07/21/2022   (No Known Allergies)    ROS:  A 15 point review of systems was performed and pertinent positives and negatives noted in HPI   Objective:     BP 131/86   Pulse 97   Ht 182.9 cm (6')   Wt (!) 105.2 kg (232 lb)   BMI 31.46 kg/m   Constitutional :   Alert, cooperative, no distress  Lymphatics/Throat:  Supple, no lymphadenopathy  Respiratory:  clear to auscultation bilaterally  Cardiovascular:  regular rate and rhythm  Gastrointestinal: soft, non-tender; bowel sounds normal; no masses,  no organomegaly. umbilical hernia noted.  moderate, overlying skin changes, and partially reducible  Musculoskeletal: Steady gait and movement  Skin: Cool and moist, no surgical scars  Psychiatric: Normal affect, non-agitated, not confused       LABS:  N/a   RADS: N/a Assessment:       Umbilical hernia with obstruction, without gangrene [K42.0]  Plan:     1. Umbilical hernia with obstruction, without gangrene [K42.0]   Discussed the risk of surgery including recurrence, which can be up to 50% in the case of incisional or complex hernias, possible use of prosthetic materials (mesh) and the increased risk of mesh infxn if used, bleeding, chronic pain, post-op infxn, post-op SBO or ileus, and possible re-operation to address said risks. The risks of general anesthetic, if used, includes MI, CVA, sudden death or even reaction to anesthetic medications also discussed. Alternatives include continued observation.  Benefits include possible symptom relief, prevention of incarceration, strangulation, enlargement in size over time, and the risk of emergency surgery in the face of strangulation.   Typical post-op recovery time of 3-5 days with 2 weeks of activity restrictions were also discussed.  ED return precautions given for sudden increase in pain, size of hernia with accompanying fever, nausea, and/or vomiting.  The patient verbalized understanding and all questions were answered to the patient's satisfaction.   2. Patient has elected to proceed with surgical treatment. Procedure will be scheduled. robotic assisted laparoscopic  labs/images/medications/previous chart entries reviewed personally and relevant  changes/updates noted above.

## 2022-08-28 ENCOUNTER — Inpatient Hospital Stay: Admission: RE | Admit: 2022-08-28 | Payer: BC Managed Care – PPO | Source: Ambulatory Visit

## 2022-09-16 IMAGING — CT CT CARDIAC CORONARY ARTERY CALCIUM SCORE
3 series · 14 of 20 positions shown, 16 images · non-contrast
Comparison: None.
COMPARISON: None.

Addendum:
EXAM:
OVER-READ INTERPRETATION  CT CHEST

The following report is an over-read performed by radiologist Dr.
Kgantse Happiness [REDACTED] on 03/04/2021. This
over-read does not include interpretation of cardiac or coronary
anatomy or pathology. The coronary calcium score interpretation by
the cardiologist is attached.
CLINICAL DATA: Risk stratification
Coronary Calcium Score
TECHNIQUE: The patient was scanned on a Siemens Somatom go.Top Scanner. Axial
non-contrast 3 mm slices were carried out through the heart. The
data set was analyzed on a dedicated work station and scored using
the Agatson method.

[Series 2: sa36 calcium scoring 3.00 · axial · 0.40mm/px · z∈[-1245,-1146]mm · 4 of 57 slices shown]
[im 12/57  vessel]
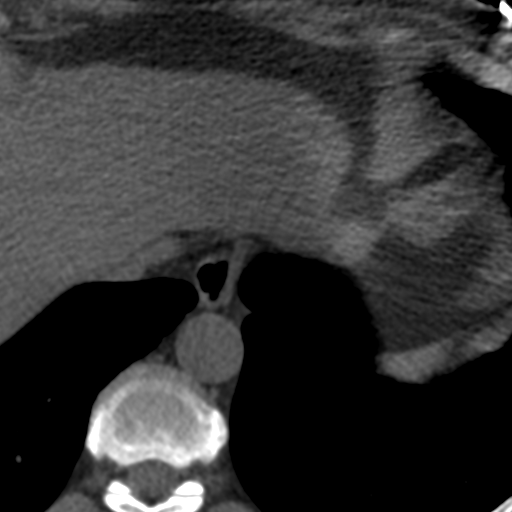
[im 23/57  vessel]
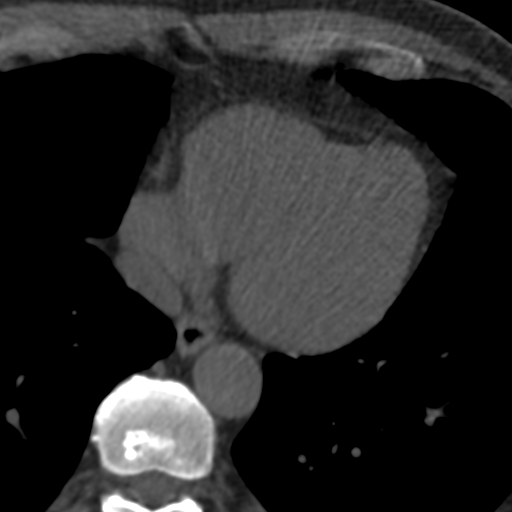
[im 34/57  vessel]
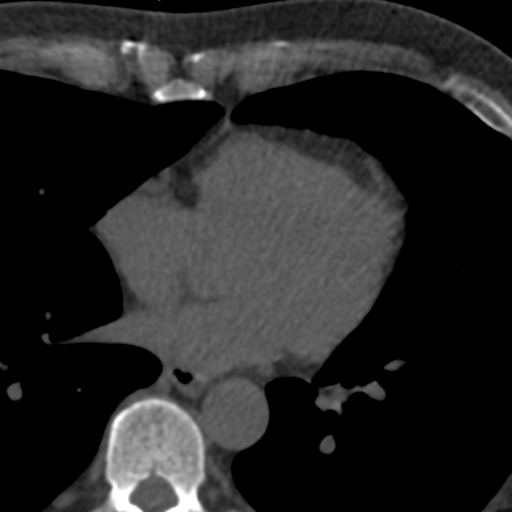
[im 45/57  vessel]
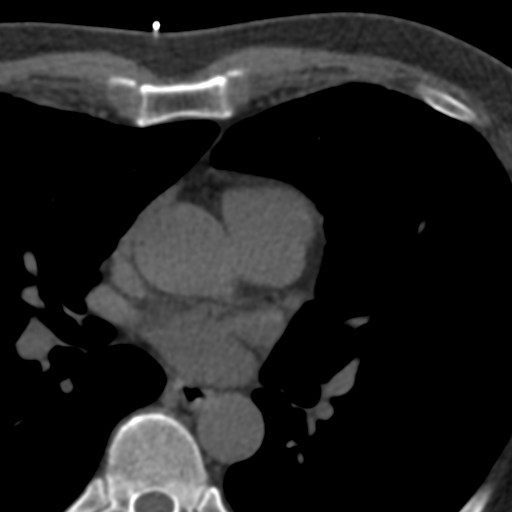

[Series 5: full fov st calcium scoring 3.00 · axial · 0.77mm/px · z∈[-1251,-1140]mm · 5 of 57 slices shown, 7 images]
[im 10/57  vessel]
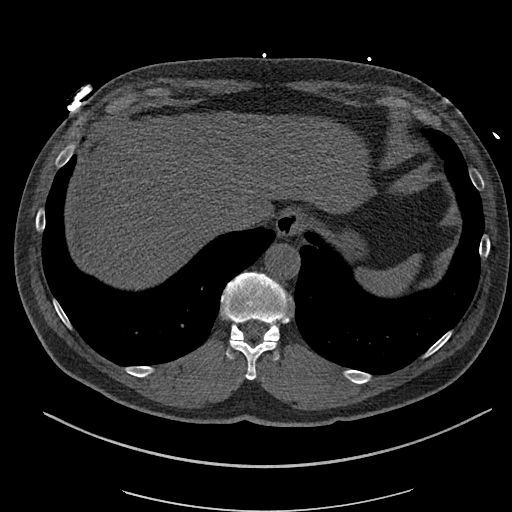
[im 10/57  lung]
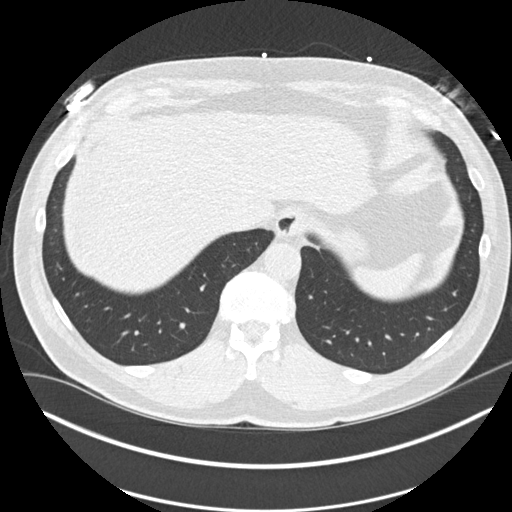
[im 19/57  vessel]
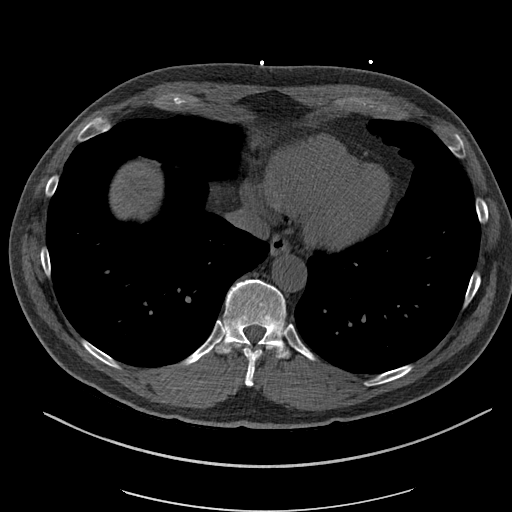
[im 29/57  vessel]
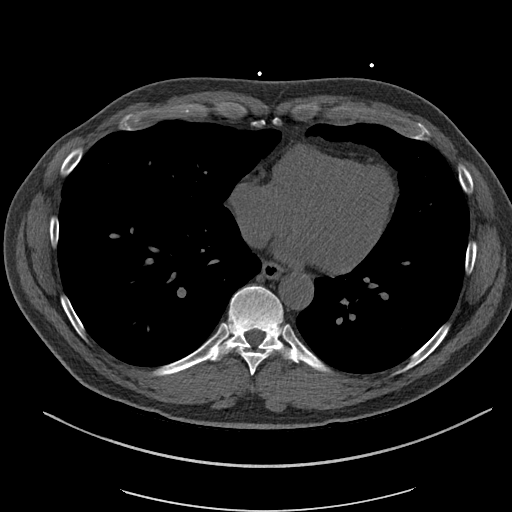
[im 38/57  vessel]
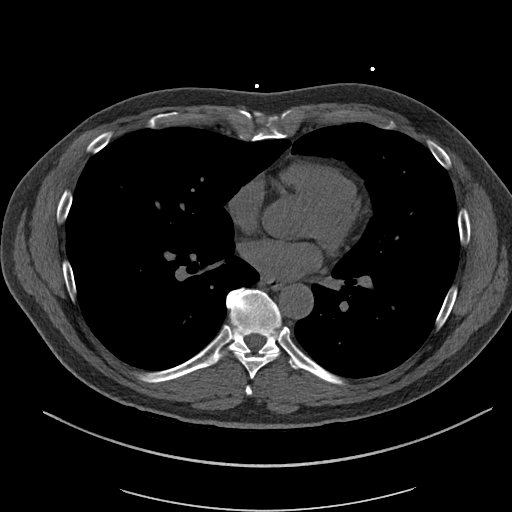
[im 47/57  vessel]
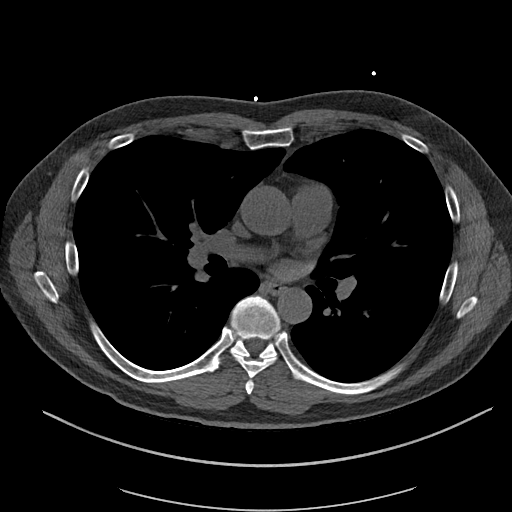
[im 47/57  lung]
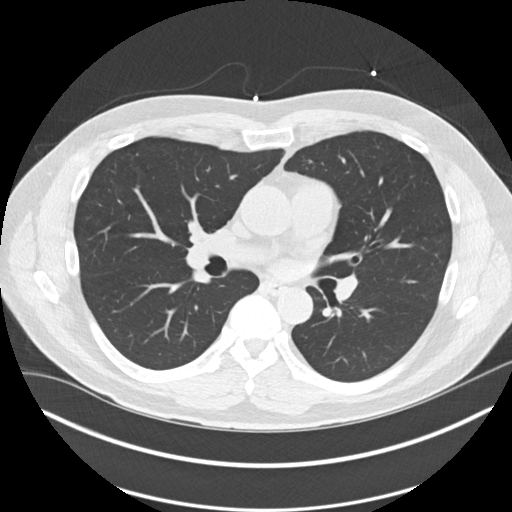

[Series 10: full fov lungs calcium scoring 3.00 ax · axial · 0.77mm/px · z∈[-1251,-1140]mm · 5 of 57 slices shown]
[im 10/57  vessel]
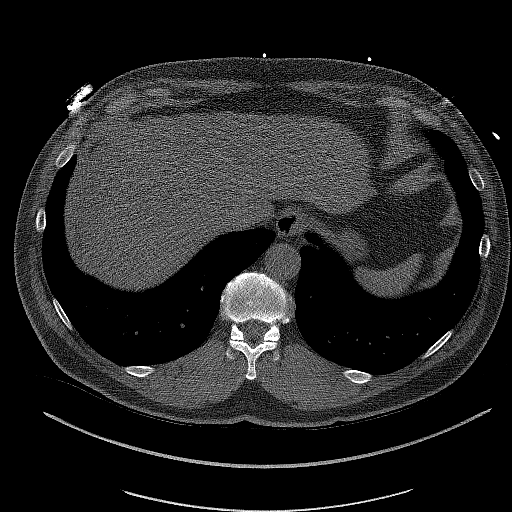
[im 19/57  vessel]
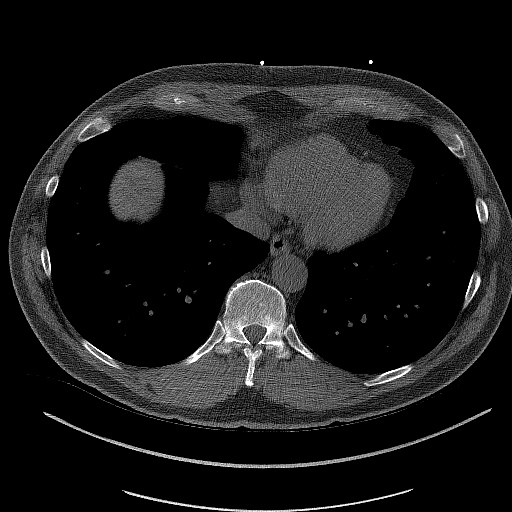
[im 29/57  vessel]
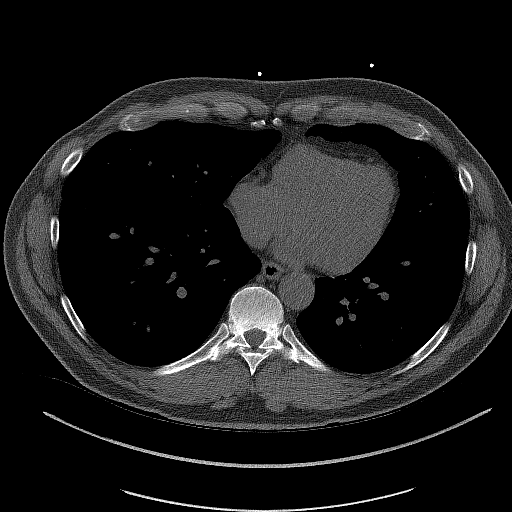
[im 38/57  vessel]
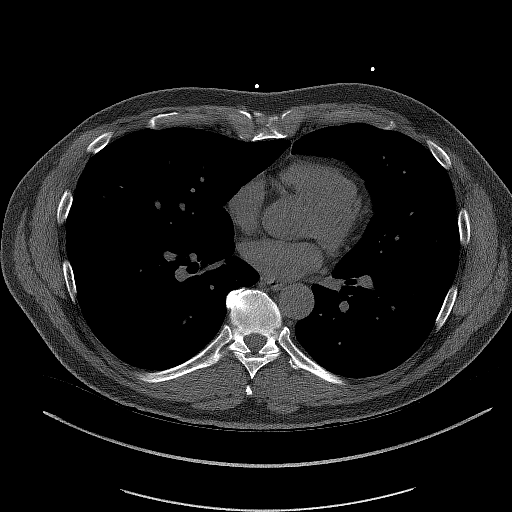
[im 47/57  vessel]
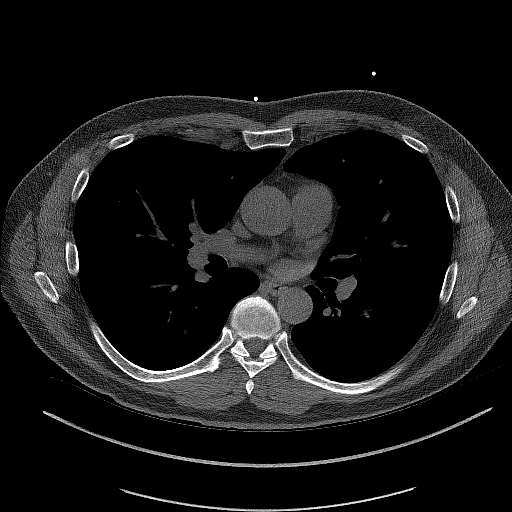

[14 of 20 positions shown; findings below may reference images not displayed]

FINDINGS: Within the visualized portions of the thorax there are no suspicious
appearing pulmonary nodules or masses, there is no acute
consolidative airspace disease, no pleural effusions, no
pneumothorax and no lymphadenopathy. Visualized portions of the
upper abdomen demonstrates mild diffuse low attenuation throughout
the visualized hepatic parenchyma, indicative of hepatic steatosis.
There are no aggressive appearing lytic or blastic lesions noted in
the visualized portions of the skeleton.
IMPRESSION: 1. Hepatic steatosis.
FINDINGS: Non-cardiac: See separate report from [REDACTED].

Ascending Aorta: Normal size

Pericardium: Normal

Coronary arteries: Normal origin of left and right coronary
arteries. Distribution of arterial calcifications if present, as
noted below;

LM 0

LAD 11

LCx

RCA 0

Total

IMPRESSION AND RECOMMENDATION:
1. Coronary calcium score of 23.9. This was 51st percentile for age
and sex matched control.

2. CAC 1-99 in LAD and LCx.  LIVIANA MALABANAN1/N2.

3. Continue heart healthy lifestyle and risk factor modification.

Lorenz Jumper

*** End of Addendum ***
EXAM:
OVER-READ INTERPRETATION  CT CHEST

The following report is an over-read performed by radiologist Dr.
Kgantse Happiness [REDACTED] on 03/04/2021. This
over-read does not include interpretation of cardiac or coronary
anatomy or pathology. The coronary calcium score interpretation by
the cardiologist is attached.
FINDINGS: Within the visualized portions of the thorax there are no suspicious
appearing pulmonary nodules or masses, there is no acute
consolidative airspace disease, no pleural effusions, no
pneumothorax and no lymphadenopathy. Visualized portions of the
upper abdomen demonstrates mild diffuse low attenuation throughout
the visualized hepatic parenchyma, indicative of hepatic steatosis.
There are no aggressive appearing lytic or blastic lesions noted in
the visualized portions of the skeleton.
IMPRESSION: 1. Hepatic steatosis.

## 2022-09-17 ENCOUNTER — Ambulatory Visit: Payer: BC Managed Care – PPO | Admitting: Urology

## 2022-09-21 ENCOUNTER — Ambulatory Visit: Payer: BC Managed Care – PPO | Admitting: Urology

## 2022-10-02 ENCOUNTER — Inpatient Hospital Stay: Admission: RE | Admit: 2022-10-02 | Payer: BC Managed Care – PPO | Source: Ambulatory Visit

## 2022-10-09 ENCOUNTER — Ambulatory Visit: Admit: 2022-10-09 | Payer: BC Managed Care – PPO | Admitting: Surgery

## 2022-10-09 SURGERY — REPAIR, HERNIA, UMBILICAL, ROBOT-ASSISTED
Anesthesia: General

## 2022-12-07 ENCOUNTER — Other Ambulatory Visit: Payer: Self-pay | Admitting: Family Medicine

## 2022-12-07 DIAGNOSIS — R209 Unspecified disturbances of skin sensation: Secondary | ICD-10-CM

## 2022-12-09 ENCOUNTER — Ambulatory Visit
Admission: RE | Admit: 2022-12-09 | Discharge: 2022-12-09 | Disposition: A | Discharge status: Home / Self Care | Payer: BC Managed Care – PPO | Source: Ambulatory Visit | Attending: Family Medicine | Admitting: Family Medicine

## 2022-12-09 DIAGNOSIS — R209 Unspecified disturbances of skin sensation: Secondary | ICD-10-CM | POA: Diagnosis present

## 2022-12-28 ENCOUNTER — Encounter: Payer: Self-pay | Admitting: Urology

## 2022-12-28 ENCOUNTER — Ambulatory Visit: Payer: BC Managed Care – PPO | Admitting: Urology

## 2022-12-28 VITALS — BP 125/86 | HR 94 | Ht 72.0 in | Wt 230.0 lb

## 2022-12-28 DIAGNOSIS — R339 Retention of urine, unspecified: Secondary | ICD-10-CM | POA: Diagnosis not present

## 2022-12-28 DIAGNOSIS — R350 Frequency of micturition: Secondary | ICD-10-CM | POA: Diagnosis not present

## 2022-12-28 DIAGNOSIS — N401 Enlarged prostate with lower urinary tract symptoms: Secondary | ICD-10-CM | POA: Diagnosis not present

## 2022-12-28 NOTE — Progress Notes (Signed)
I,Maysun L Gibbs,acting as a scribe for [name], MD.,have documented all relevant documentation on the behalf of [name], MD,as directed by  [name], MD while in the presence of [name], MD.  12/28/2022 3:36 PM   Blake Strickland [birth year] [id number]  Referring provider: [name], MD [id number] [facility] [place],  [place] [address]  Chief Complaint  Patient presents with   Benign Prostatic Hypertrophy   Urologic history: 1.  BPH with LUTS   2.  Hypogonadism Previously on testosterone and Clomid  HPI: Blake Strickland is a 62 y.o. male here for annual follow-up.  At last visit, he was given Rx Alfuzosin for worsening lower urinary tract symptoms. He elected not to start because he was concerned about worsening of chronic kidney disease after reading the package insert. He did not follow up for a repeat bladder scan.  He recently got genetic testing and part of the health screening was flagged as increased risk for prostate cancer.  PSA performed November 2023 was very low at 0.3 and has been stable.  He has moderate lower urinary tract symptoms  IPSS today of 11/35   PMH: Past Medical History:  Diagnosis Date   Achilles tendonitis    Asthma    Atypical mole 09/05/2019   R lat clavicle    Dysplastic nevus 06/27/2019   right lateral clavicle dysplastic junctional lentiginous nevus with severe atypia irritated limited margins free   Elevated cholesterol    Hypertension     Surgical History: Past Surgical History:  Procedure Laterality Date   COLONOSCOPY  2016   NOSE SURGERY      Home Medications:  Allergies as of 12/28/2022       Reactions   Other Other (See Comments)   Intolerance to eggs - "sinus drainage" Intolerance to eggs - "sinus drainage"        Medication List        Accurate as of Dec 28, 2022  3:36 PM. If you have any questions, ask your nurse or doctor.          STOP taking these  medications    alfuzosin 10 MG 24 hr tablet Commonly known as: UROXATRAL Stopped by: [name], MD   doxycycline 100 MG capsule Commonly known as: VIBRAMYCIN Stopped by: [name], MD   eszopiclone 2 MG Tabs tablet Commonly known as: LUNESTA Stopped by: [name], MD   Trintellix 10 MG Tabs tablet Generic drug: vortioxetine HBr Stopped by: [name], MD       TAKE these medications    DEPLIN PO Take by mouth.   lamoTRIgine 100 MG tablet Commonly known as: LAMICTAL Take by mouth.   One Daily tablet Take by mouth.   rosuvastatin 10 MG tablet Commonly known as: CRESTOR   telmisartan-hydrochlorothiazide 80-12.5 MG tablet Commonly known as: MICARDIS HCT Take 1 tablet by mouth daily.   testosterone 50 MG/5GM (1%) Gel Commonly known as: ANDROGEL Apply topically.        Allergies:  Allergies  Allergen Reactions   Other Other (See Comments)    Intolerance to eggs - "sinus drainage" Intolerance to eggs - "sinus drainage"     Social History:  reports that he has never smoked. He has never used smokeless tobacco. He reports current alcohol use. He reports that he does not use drugs.   Physical Exam: BP 125/86   Pulse 94   Ht  6' (1.829 m)   Wt 230 lb (104.3 kg)   BMI 31.19 kg/m   Constitutional:  Alert and oriented, No acute distress. HEENT: [place] AT, moist mucus membranes.  Trachea midline, no masses. Cardiovascular: No clubbing, cyanosis, or edema. Respiratory: Normal respiratory effort, no increased work of breathing. GI: Abdomen is soft, nontender, nondistended, no abdominal masses GU: Prostate 30 grams, smooth without nodules. Consistency is normal.  Skin: No rashes, bruises or suspicious lesions. Neurologic: Grossly intact, no focal deficits, moving all 4 extremities. Psychiatric: Normal mood and affect.   Assessment & Plan:    1. BPH with incomplete bladder emptying Voiding symptoms have improved and are presently  moderate PVR today, improved at 109 cc 1 year follow-up with PVR and DRE as he gets his PSA checked annually by his PCP.   I have reviewed the above documentation for accuracy and completeness, and I agree with the above.   [name], MD  [facility] [address], [address] [place], [place] [address] [phone]

## 2022-12-29 ENCOUNTER — Encounter: Payer: Self-pay | Admitting: Urology

## 2022-12-29 LAB — MICROSCOPIC EXAMINATION: Bacteria, UA: NONE SEEN

## 2022-12-29 LAB — URINALYSIS, COMPLETE
Bilirubin, UA: NEGATIVE
Glucose, UA: NEGATIVE
Ketones, UA: NEGATIVE
Leukocytes,UA: NEGATIVE
Nitrite, UA: NEGATIVE
Protein,UA: NEGATIVE
Specific Gravity, UA: <1.005 — ABNORMAL LOW (ref 1.005–1.030)
Urobilinogen, Ur: 0.2 mg/dL (ref 0.2–1.0)
pH, UA: 5.5 (ref 5.0–7.5)

## 2023-01-01 ENCOUNTER — Encounter: Payer: Self-pay | Admitting: Urology

## 2023-05-31 ENCOUNTER — Encounter: Payer: Self-pay | Admitting: Urology

## 2023-06-02 ENCOUNTER — Ambulatory Visit: Payer: BC Managed Care – PPO | Admitting: Dermatology

## 2023-06-03 ENCOUNTER — Ambulatory Visit: Payer: Self-pay | Admitting: Surgery

## 2023-06-03 ENCOUNTER — Other Ambulatory Visit: Payer: Self-pay

## 2023-06-03 ENCOUNTER — Encounter
Admission: RE | Admit: 2023-06-03 | Discharge: 2023-06-03 | Disposition: A | Discharge status: Home / Self Care | Payer: BC Managed Care – PPO | Source: Ambulatory Visit | Attending: Surgery | Admitting: Surgery

## 2023-06-03 VITALS — Ht 72.0 in | Wt 225.0 lb

## 2023-06-03 DIAGNOSIS — Z79899 Other long term (current) drug therapy: Secondary | ICD-10-CM

## 2023-06-03 DIAGNOSIS — N182 Chronic kidney disease, stage 2 (mild): Secondary | ICD-10-CM

## 2023-06-03 DIAGNOSIS — I10 Essential (primary) hypertension: Secondary | ICD-10-CM

## 2023-06-03 HISTORY — DX: Depression, unspecified: F32.A

## 2023-06-03 HISTORY — DX: Gastro-esophageal reflux disease without esophagitis: K21.9

## 2023-06-03 HISTORY — DX: Chronic kidney disease, stage 2 (mild): N18.2

## 2023-06-03 HISTORY — DX: Unspecified malignant neoplasm of skin, unspecified: C44.90

## 2023-06-03 HISTORY — DX: Benign prostatic hyperplasia without lower urinary tract symptoms: N40.0

## 2023-06-03 HISTORY — DX: Sleep apnea, unspecified: G47.30

## 2023-06-03 HISTORY — DX: Anxiety disorder, unspecified: F41.9

## 2023-06-03 NOTE — Patient Instructions (Signed)
Your procedure is scheduled on: Friday 06/11/23 To find out your arrival time, please call [phone] between 1PM - 3PM on: Thursday 06/10/23   Report to the Registration Desk on the 1st floor of the [facility]. Free Valet parking is available.  If your arrival time is 6:00 am, do not arrive before that time as the [facility] entrance doors do not open until 6:00 am.  REMEMBER: Instructions that are not followed completely may result in serious medical risk, up to and including death; or upon the discretion of your surgeon and anesthesiologist your surgery may need to be rescheduled.  Do not eat food after midnight the night before surgery.  No gum chewing or hard candies.  You may however, drink CLEAR liquids up to 2 hours before you are scheduled to arrive for your surgery. Do not drink anything within 2 hours of your scheduled arrival time.  Clear liquids include: - water  - apple juice without pulp - gatorade (not RED colors) - black coffee or tea (Do NOT add milk or creamers to the coffee or tea) Do NOT drink anything that is not on this list.  One week prior to surgery: Stop Anti-inflammatories (NSAIDS) such as Advil, Aleve, Ibuprofen, Motrin, Naproxen, Naprosyn and Aspirin based products such as Excedrin, Goody's Powder, BC Powder. You may however, continue to take Tylenol if needed for pain up until the day of surgery.  Stop ANY OVER THE COUNTER supplements or vitamins for 7 days until after surgery.  Continue taking all prescribed medications with the exception of the following: Do not take the telmisartan-hydrochlorothiazide (MICARDIS HCT) 80-12.5 MG tablet .  TAKE ONLY THESE MEDICATIONS THE MORNING OF SURGERY WITH A SIP OF WATER:  FLUoxetine (PROZAC) 40 MG capsule  lamoTRIgine (LAMICTAL) 200 MG tablet   No Alcohol for 24 hours before or after surgery.  No Smoking including e-cigarettes for 24 hours before surgery.  No chewable tobacco products for at least 6  hours before surgery.  No nicotine patches on the day of surgery.  Do not use any "recreational" drugs for at least a week (preferably 2 weeks) before your surgery.  Please be advised that the combination of cocaine and anesthesia may have negative outcomes, up to and including death. If you test positive for cocaine, your surgery will be cancelled.  On the morning of surgery brush your teeth with toothpaste and water, you may rinse your mouth with mouthwash if you wish. Do not swallow any toothpaste or mouthwash.  Use CHG Soap or wipes as directed on instruction sheet.  Do not wear lotions, powders, or perfumes.   Do not shave body hair from the neck down 48 hours before surgery.  Wear comfortable clothing (specific to your surgery type) to the hospital.  Do not wear jewelry, make-up, hairpins, clips or nail polish.  For welded (permanent) jewelry: bracelets, anklets, waist bands, etc.  Please have this removed prior to surgery.  If it is not removed, there is a chance that hospital personnel will need to cut it off on the day of surgery. Contact lenses, hearing aids and dentures may not be worn into surgery.  Bring your C-PAP to the hospital in case you may have to spend the night. You may leave it in the car.  Do not bring valuables to the hospital. [facility] is not responsible for any missing/lost belongings or valuables.   Notify your doctor if there is any change in your medical condition (cold, fever, infection).  If you are being discharged the day of surgery, you will not be allowed to drive home. You will need a responsible individual to drive you home and stay with you for 24 hours after surgery.   If you are taking public transportation, you will need to have a responsible individual with you.  If you are being admitted to the hospital overnight, leave your suitcase in the car. After surgery it may be brought to your room.  In case of increased patient census, it may  be necessary for you, the patient, to continue your postoperative care in the Same Day Surgery department.  After surgery, you can help prevent lung complications by doing breathing exercises.  Take deep breaths and cough every 1-2 hours. Your doctor may order a device called an Incentive Spirometer to help you take deep breaths. When coughing or sneezing, hold a pillow firmly against your incision with both hands. This is called "splinting." Doing this helps protect your incision. It also decreases belly discomfort.  Surgery Visitation Policy:  Patients undergoing a surgery or procedure may have two family members or support persons with them as long as the person is not COVID-19 positive or experiencing its symptoms.   Inpatient Visitation:    Visiting hours are 7 a.m. to 8 p.m. Up to four visitors are allowed at one time in a patient room. The visitors may rotate out with other people during the day. One designated support person (adult) may remain overnight.  Please call the Pre-admissions Testing Dept. at [phone] if you have any questions about these instructions.     Preparing for Surgery with CHLORHEXIDINE GLUCONATE (CHG) Soap  Chlorhexidine Gluconate (CHG) Soap  o An antiseptic cleaner that kills germs and bonds with the skin to continue killing germs even after washing  o Used for showering the night before surgery and morning of surgery  Before surgery, you can play an important role by reducing the number of germs on your skin.  CHG (Chlorhexidine gluconate) soap is an antiseptic cleanser which kills germs and bonds with the skin to continue killing germs even after washing.  Please do not use if you have an allergy to CHG or antibacterial soaps. If your skin becomes reddened/irritated stop using the CHG.  1. Shower the NIGHT BEFORE SURGERY and the MORNING OF SURGERY with CHG soap.  2. If you choose to wash your hair, wash your hair first as usual with your normal  shampoo.  3. After shampooing, rinse your hair and body thoroughly to remove the shampoo.  4. Use CHG as you would any other liquid soap. You can apply CHG directly to the skin and wash gently with a scrungie or a clean washcloth.  5. Apply the CHG soap to your body only from the neck down. Do not use on open wounds or open sores. Avoid contact with your eyes, ears, mouth, and genitals (private parts). Wash face and genitals (private parts) with your normal soap.  6. Wash thoroughly, paying special attention to the area where your surgery will be performed.  7. Thoroughly rinse your body with warm water.  8. Do not shower/wash with your normal soap after using and rinsing off the CHG soap.  9. Pat yourself dry with a clean towel.  10. Wear clean pajamas to bed the night before surgery.  12. Place clean sheets on your bed the night of your first shower and do not sleep with pets.  13. Shower again with the CHG soap  on the day of surgery prior to arriving at the hospital.  14. Do not apply any deodorants/lotions/powders.  15. Please wear clean clothes to the hospital.

## 2023-06-04 ENCOUNTER — Inpatient Hospital Stay: Admission: RE | Admit: 2023-06-04 | Payer: BC Managed Care – PPO | Source: Ambulatory Visit

## 2023-06-04 NOTE — Plan of Care (Signed)
CHL Tonsillectomy/Adenoidectomy, Postoperative PEDS care plan entered in error.

## 2023-06-07 ENCOUNTER — Encounter: Payer: Self-pay | Admitting: Urgent Care

## 2023-06-07 ENCOUNTER — Encounter
Admission: RE | Admit: 2023-06-07 | Discharge: 2023-06-07 | Disposition: A | Discharge status: Home / Self Care | Payer: BC Managed Care – PPO | Source: Ambulatory Visit | Attending: Surgery | Admitting: Surgery

## 2023-06-07 DIAGNOSIS — Z79899 Other long term (current) drug therapy: Secondary | ICD-10-CM | POA: Insufficient documentation

## 2023-06-07 DIAGNOSIS — N182 Chronic kidney disease, stage 2 (mild): Secondary | ICD-10-CM | POA: Insufficient documentation

## 2023-06-07 DIAGNOSIS — Z01818 Encounter for other preprocedural examination: Secondary | ICD-10-CM | POA: Insufficient documentation

## 2023-06-07 DIAGNOSIS — I129 Hypertensive chronic kidney disease with stage 1 through stage 4 chronic kidney disease, or unspecified chronic kidney disease: Secondary | ICD-10-CM | POA: Insufficient documentation

## 2023-06-07 DIAGNOSIS — I10 Essential (primary) hypertension: Secondary | ICD-10-CM

## 2023-06-07 LAB — BASIC METABOLIC PANEL
Anion gap: 9 (ref 5–15)
BUN: 21 mg/dL (ref 8–23)
CO2: 26 mmol/L (ref 22–32)
Calcium: 8.9 mg/dL (ref 8.9–10.3)
Chloride: 99 mmol/L (ref 98–111)
Creatinine, Ser: 0.95 mg/dL (ref 0.61–1.24)
GFR, Estimated: >60 mL/min (ref >60)
Glucose, Bld: 111 mg/dL — ABNORMAL HIGH (ref 70–99)
Potassium: 3.9 mmol/L (ref 3.5–5.1)
Sodium: 134 mmol/L — ABNORMAL LOW (ref 135–145)

## 2023-06-07 LAB — CBC
HCT: 43.5 % (ref 39.0–52.0)
Hemoglobin: 14.5 g/dL (ref 13.0–17.0)
MCH: 29 pg (ref 26.0–34.0)
MCHC: 33.3 g/dL (ref 30.0–36.0)
MCV: 87 fL (ref 80.0–100.0)
Platelets: 299 10*3/uL (ref 150–400)
RBC: 5 MIL/uL (ref 4.22–5.81)
RDW: 13.4 % (ref 11.5–15.5)
WBC: 7.8 10*3/uL (ref 4.0–10.5)
nRBC: 0 % (ref 0.0–0.2)

## 2023-06-10 MED ORDER — CHLORHEXIDINE GLUCONATE CLOTH 2 % EX PADS: 6.0000 | MEDICATED_PAD | Freq: Once | CUTANEOUS | Status: DC

## 2023-06-10 MED ORDER — LACTATED RINGERS IV SOLN: INTRAVENOUS | Status: DC

## 2023-06-10 MED ORDER — ACETAMINOPHEN 500 MG PO TABS
1000.0000 mg | ORAL_TABLET | ORAL | Status: AC
  Administered 2023-06-11: 1000 mg via ORAL

## 2023-06-10 MED ORDER — GABAPENTIN 300 MG PO CAPS
300.0000 mg | ORAL_CAPSULE | ORAL | Status: AC
  Administered 2023-06-11: 300 mg via ORAL

## 2023-06-10 MED ORDER — CELECOXIB 200 MG PO CAPS
200.0000 mg | ORAL_CAPSULE | ORAL | Status: AC
  Administered 2023-06-11: 200 mg via ORAL

## 2023-06-10 MED ORDER — CHLORHEXIDINE GLUCONATE 0.12 % MT SOLN
15.0000 mL | Freq: Once | OROMUCOSAL | Status: AC
  Administered 2023-06-11: 15 mL via OROMUCOSAL

## 2023-06-10 MED ORDER — ORAL CARE MOUTH RINSE: 15.0000 mL | Freq: Once | OROMUCOSAL | Status: AC

## 2023-06-10 MED ORDER — CEFAZOLIN SODIUM-DEXTROSE 2-4 GM/100ML-% IV SOLN
2.0000 g | INTRAVENOUS | Status: AC
  Administered 2023-06-11: 2 g via INTRAVENOUS

## 2023-06-11 ENCOUNTER — Other Ambulatory Visit: Payer: Self-pay

## 2023-06-11 ENCOUNTER — Encounter: Payer: Self-pay | Admitting: Surgery

## 2023-06-11 ENCOUNTER — Ambulatory Visit: Payer: BC Managed Care – PPO | Admitting: Urgent Care

## 2023-06-11 ENCOUNTER — Ambulatory Visit
Admission: RE | Admit: 2023-06-11 | Discharge: 2023-06-11 | Disposition: A | Discharge status: Home / Self Care | Payer: BC Managed Care – PPO | Attending: Surgery | Admitting: Surgery

## 2023-06-11 ENCOUNTER — Encounter
Admission: RE | Disposition: A | Discharge status: Home / Self Care | Payer: Self-pay | Source: Home / Self Care | Attending: Surgery

## 2023-06-11 DIAGNOSIS — N183 Chronic kidney disease, stage 3 unspecified: Secondary | ICD-10-CM | POA: Diagnosis not present

## 2023-06-11 DIAGNOSIS — K420 Umbilical hernia with obstruction, without gangrene: Secondary | ICD-10-CM | POA: Insufficient documentation

## 2023-06-11 DIAGNOSIS — Z8616 Personal history of COVID-19: Secondary | ICD-10-CM | POA: Diagnosis not present

## 2023-06-11 DIAGNOSIS — F419 Anxiety disorder, unspecified: Secondary | ICD-10-CM | POA: Insufficient documentation

## 2023-06-11 DIAGNOSIS — K429 Umbilical hernia without obstruction or gangrene: Secondary | ICD-10-CM

## 2023-06-11 DIAGNOSIS — F32A Depression, unspecified: Secondary | ICD-10-CM | POA: Diagnosis not present

## 2023-06-11 DIAGNOSIS — I129 Hypertensive chronic kidney disease with stage 1 through stage 4 chronic kidney disease, or unspecified chronic kidney disease: Secondary | ICD-10-CM | POA: Insufficient documentation

## 2023-06-11 SURGERY — REPAIR, HERNIA, UMBILICAL, ROBOT-ASSISTED
Anesthesia: General | Site: Abdomen

## 2023-06-11 MED ORDER — OXYCODONE HCL 5 MG PO TABS: 5.0000 mg | ORAL_TABLET | Freq: Once | ORAL | Status: DC | PRN

## 2023-06-11 MED ORDER — GABAPENTIN 300 MG PO CAPS
ORAL_CAPSULE | ORAL | Status: AC
  Filled 2023-06-11: qty 1

## 2023-06-11 MED ORDER — SUGAMMADEX SODIUM 200 MG/2ML IV SOLN
INTRAVENOUS | Status: DC | PRN
  Administered 2023-06-11: 200 mg via INTRAVENOUS

## 2023-06-11 MED ORDER — DEXMEDETOMIDINE HCL IN NACL 200 MCG/50ML IV SOLN
INTRAVENOUS | Status: DC | PRN
Start: 2023-06-11 — End: 2023-06-11
  Administered 2023-06-11: 12 ug via INTRAVENOUS
  Administered 2023-06-11: 8 ug via INTRAVENOUS

## 2023-06-11 MED ORDER — BUPIVACAINE-EPINEPHRINE (PF) 0.5% -1:200000 IJ SOLN
INTRAMUSCULAR | Status: AC
  Filled 2023-06-11: qty 30

## 2023-06-11 MED ORDER — CEFAZOLIN SODIUM-DEXTROSE 2-4 GM/100ML-% IV SOLN
INTRAVENOUS | Status: AC
  Filled 2023-06-11: qty 100

## 2023-06-11 MED ORDER — SUCCINYLCHOLINE CHLORIDE 200 MG/10ML IV SOSY
PREFILLED_SYRINGE | INTRAVENOUS | Status: DC | PRN
  Administered 2023-06-11: 100 mg via INTRAVENOUS

## 2023-06-11 MED ORDER — CELECOXIB 200 MG PO CAPS
ORAL_CAPSULE | ORAL | Status: AC
  Filled 2023-06-11: qty 1

## 2023-06-11 MED ORDER — SODIUM CHLORIDE 0.9 % IV SOLN
INTRAVENOUS | Status: DC | PRN
  Administered 2023-06-11: 50 mL

## 2023-06-11 MED ORDER — PHENYLEPHRINE 80 MCG/ML (10ML) SYRINGE FOR IV PUSH (FOR BLOOD PRESSURE SUPPORT)
PREFILLED_SYRINGE | INTRAVENOUS | Status: DC | PRN
  Administered 2023-06-11: 160 ug via INTRAVENOUS
  Administered 2023-06-11: 80 ug via INTRAVENOUS

## 2023-06-11 MED ORDER — DEXAMETHASONE SODIUM PHOSPHATE 10 MG/ML IJ SOLN
INTRAMUSCULAR | Status: DC | PRN
  Administered 2023-06-11: 10 mg via INTRAVENOUS

## 2023-06-11 MED ORDER — OXYCODONE HCL 5 MG/5ML PO SOLN
5.0000 mg | Freq: Once | ORAL | Status: DC | PRN
Start: 2023-06-11 — End: 2023-06-11

## 2023-06-11 MED ORDER — HYDROMORPHONE HCL 1 MG/ML IJ SOLN
INTRAMUSCULAR | Status: DC | PRN
  Administered 2023-06-11: 1 mg via INTRAVENOUS

## 2023-06-11 MED ORDER — ONDANSETRON HCL 4 MG/2ML IJ SOLN
INTRAMUSCULAR | Status: DC | PRN
  Administered 2023-06-11 (×2): 4 mg via INTRAVENOUS

## 2023-06-11 MED ORDER — FENTANYL CITRATE (PF) 100 MCG/2ML IJ SOLN
INTRAMUSCULAR | Status: AC
  Filled 2023-06-11: qty 2

## 2023-06-11 MED ORDER — DOCUSATE SODIUM 100 MG PO CAPS
100.0000 mg | ORAL_CAPSULE | Freq: Two times a day (BID) | ORAL | 0 refills | Status: AC | PRN
Start: 2023-06-11 — End: 2023-06-21

## 2023-06-11 MED ORDER — EPHEDRINE SULFATE-NACL 50-0.9 MG/10ML-% IV SOSY
PREFILLED_SYRINGE | INTRAVENOUS | Status: DC | PRN
  Administered 2023-06-11 (×2): 5 mg via INTRAVENOUS

## 2023-06-11 MED ORDER — HYDROMORPHONE HCL 1 MG/ML IJ SOLN
INTRAMUSCULAR | Status: AC
  Filled 2023-06-11: qty 1

## 2023-06-11 MED ORDER — HYDROCODONE-ACETAMINOPHEN 5-325 MG PO TABS: 1.0000 | ORAL_TABLET | Freq: Four times a day (QID) | ORAL | 0 refills | Status: AC | PRN

## 2023-06-11 MED ORDER — SODIUM CHLORIDE FLUSH 0.9 % IV SOLN
INTRAVENOUS | Status: AC
  Filled 2023-06-11: qty 30

## 2023-06-11 MED ORDER — LIDOCAINE HCL (CARDIAC) PF 100 MG/5ML IV SOSY
PREFILLED_SYRINGE | INTRAVENOUS | Status: DC | PRN
  Administered 2023-06-11: 100 mg via INTRAVENOUS

## 2023-06-11 MED ORDER — HYDROMORPHONE HCL 1 MG/ML IJ SOLN: 0.2500 mg | INTRAMUSCULAR | Status: DC | PRN

## 2023-06-11 MED ORDER — ROCURONIUM BROMIDE 100 MG/10ML IV SOLN
INTRAVENOUS | Status: DC | PRN
  Administered 2023-06-11: 40 mg via INTRAVENOUS
  Administered 2023-06-11: 20 mg via INTRAVENOUS
  Administered 2023-06-11: 10 mg via INTRAVENOUS

## 2023-06-11 MED ORDER — ACETAMINOPHEN 500 MG PO TABS
ORAL_TABLET | ORAL | Status: AC
  Filled 2023-06-11: qty 2

## 2023-06-11 MED ORDER — CHLORHEXIDINE GLUCONATE 0.12 % MT SOLN
OROMUCOSAL | Status: AC
  Filled 2023-06-11: qty 15

## 2023-06-11 MED ORDER — PHENYLEPHRINE HCL-NACL 20-0.9 MG/250ML-% IV SOLN
INTRAVENOUS | Status: DC | PRN
  Administered 2023-06-11: 25 ug/min via INTRAVENOUS

## 2023-06-11 MED ORDER — MIDAZOLAM HCL 2 MG/2ML IJ SOLN
INTRAMUSCULAR | Status: DC | PRN
  Administered 2023-06-11: 2 mg via INTRAVENOUS

## 2023-06-11 MED ORDER — BUPIVACAINE LIPOSOME 1.3 % IJ SUSP
INTRAMUSCULAR | Status: AC
  Filled 2023-06-11: qty 20

## 2023-06-11 MED ORDER — PROPOFOL 10 MG/ML IV BOLUS
INTRAVENOUS | Status: DC | PRN
  Administered 2023-06-11: 200 mg via INTRAVENOUS

## 2023-06-11 MED ORDER — PROPOFOL 1000 MG/100ML IV EMUL
INTRAVENOUS | Status: AC
  Filled 2023-06-11: qty 100

## 2023-06-11 MED ORDER — BUPIVACAINE-EPINEPHRINE 0.5% -1:200000 IJ SOLN
INTRAMUSCULAR | Status: DC | PRN
  Administered 2023-06-11: 30 mL

## 2023-06-11 MED ORDER — GLYCOPYRROLATE 0.2 MG/ML IJ SOLN
INTRAMUSCULAR | Status: DC | PRN
  Administered 2023-06-11: .2 mg via INTRAVENOUS

## 2023-06-11 MED ORDER — MIDAZOLAM HCL 2 MG/2ML IJ SOLN
INTRAMUSCULAR | Status: AC
  Filled 2023-06-11: qty 2

## 2023-06-11 MED ORDER — SEVOFLURANE IN SOLN
RESPIRATORY_TRACT | Status: AC
  Filled 2023-06-11: qty 250

## 2023-06-11 MED ORDER — FENTANYL CITRATE (PF) 100 MCG/2ML IJ SOLN
INTRAMUSCULAR | Status: DC | PRN
  Administered 2023-06-11 (×2): 50 ug via INTRAVENOUS

## 2023-06-11 MED ORDER — ACETAMINOPHEN 325 MG PO TABS
650.0000 mg | ORAL_TABLET | Freq: Three times a day (TID) | ORAL | 0 refills | Status: AC | PRN
Start: 2023-06-11 — End: 2023-07-11

## 2023-06-11 SURGICAL SUPPLY — 51 items
ADH SKN CLS APL DERMABOND .7 (GAUZE/BANDAGES/DRESSINGS) ×1
BLADE SURG SZ11 CARB STEEL (BLADE) ×1 IMPLANT
COVER TIP SHEARS 8 DVNC (MISCELLANEOUS) ×1 IMPLANT
COVER WAND RF STERILE (DRAPES) ×1 IMPLANT
DERMABOND ADVANCED .7 DNX12 (GAUZE/BANDAGES/DRESSINGS) ×1 IMPLANT
DRAPE ARM DVNC X/XI (DISPOSABLE) ×3 IMPLANT
DRAPE COLUMN DVNC XI (DISPOSABLE) ×1 IMPLANT
ELECT CAUTERY BLADE 6.4 (BLADE) ×1 IMPLANT
ELECT REM PT RETURN 9FT ADLT (ELECTROSURGICAL) ×1
ELECTRODE REM PT RTRN 9FT ADLT (ELECTROSURGICAL) ×1 IMPLANT
FORCEPS BPLR FENES DVNC XI (FORCEP) ×1 IMPLANT
GLOVE BIOGEL PI IND STRL 7.0 (GLOVE) ×2 IMPLANT
GLOVE SURG SYN 6.5 ES PF (GLOVE) ×3
GLOVE SURG SYN 6.5 PF PI (GLOVE) ×2 IMPLANT
GOWN STRL REUS W/ TWL LRG LVL3 (GOWN DISPOSABLE) ×3 IMPLANT
GOWN STRL REUS W/TWL LRG LVL3 (GOWN DISPOSABLE) ×3
GRASPER SUT TROCAR 14GX15 (MISCELLANEOUS) IMPLANT
IRRIGATOR SUCT 8 DISP DVNC XI (IRRIGATION / IRRIGATOR) IMPLANT
IV NS 1000ML (IV SOLUTION)
IV NS 1000ML BAXH (IV SOLUTION) IMPLANT
LABEL OR SOLS (LABEL) ×1 IMPLANT
MANIFOLD NEPTUNE II (INSTRUMENTS) ×1 IMPLANT
MESH PROGRIP HERNIA FLAT 15X15 (Mesh General) IMPLANT
NDL DRIVE SUT CUT DVNC (INSTRUMENTS) ×1 IMPLANT
NDL HYPO 22X1.5 SAFETY MO (MISCELLANEOUS) ×1 IMPLANT
NDL INSUFFLATION 14GA 120MM (NEEDLE) ×1 IMPLANT
NEEDLE DRIVE SUT CUT DVNC (INSTRUMENTS) ×1
NEEDLE HYPO 22X1.5 SAFETY MO (MISCELLANEOUS) ×1
NEEDLE INSUFFLATION 14GA 120MM (NEEDLE) ×1
OBTURATOR OPTICAL STND 8 DVNC (TROCAR) ×1
OBTURATOR OPTICALSTD 8 DVNC (TROCAR) ×1 IMPLANT
PACK LAP CHOLECYSTECTOMY (MISCELLANEOUS) ×1 IMPLANT
PENCIL SMOKE EVACUATOR (MISCELLANEOUS) ×1 IMPLANT
SCISSORS MNPLR CVD DVNC XI (INSTRUMENTS) ×1 IMPLANT
SEAL UNIV 5-12 XI (MISCELLANEOUS) ×3 IMPLANT
SET TUBE SMOKE EVAC HIGH FLOW (TUBING) ×1 IMPLANT
SOL ELECTROSURG ANTI STICK (MISCELLANEOUS) ×1
SOLUTION ELECTROSURG ANTI STCK (MISCELLANEOUS) ×1 IMPLANT
SUT MNCRL AB 4-0 PS2 18 (SUTURE) ×1 IMPLANT
SUT STRATAFIX 0 PDS+ CT-2 23 (SUTURE) ×1
SUT V-LOC 90 ABS 3-0 VLT V-20 (SUTURE) IMPLANT
SUT V-LOC 90 ABS DVC 3-0 CL (SUTURE) IMPLANT
SUT VIC AB 3-0 SH 27 (SUTURE) ×1
SUT VIC AB 3-0 SH 27X BRD (SUTURE) IMPLANT
SUT VICRYL 0 UR6 27IN ABS (SUTURE) ×1 IMPLANT
SUTURE STRATFX 0 PDS+ CT-2 23 (SUTURE) ×1 IMPLANT
SYR 30ML LL (SYRINGE) ×1 IMPLANT
SYSTEM WECK SHIELD CLOSURE (TROCAR) IMPLANT
TRAP FLUID SMOKE EVACUATOR (MISCELLANEOUS) ×1 IMPLANT
TRAY FOLEY MTR SLVR 16FR STAT (SET/KITS/TRAYS/PACK) ×1 IMPLANT
WATER STERILE IRR 500ML POUR (IV SOLUTION) ×1 IMPLANT

## 2023-06-11 NOTE — H&P (Signed)
Subjective:   CC: Umbilical hernia with obstruction, without gangrene [K42.0]  HPI: Blake Strickland is a 62 y.o. male who returns for evaluation of above. Recent COVID infection and had to postpone originally scheduled surgery.  Past Medical History: has a past medical history of Allergic state, Asthma, Atypical mole, Broken nose (08/17/1981), Chickenpox, Depression, Diverticulosis (12/05/2014), Dysplastic nevus, Hypercholesteremia, Hypertension, Internal hemorrhoids (12/05/2014), and Stage 3 chronic kidney disease (CMS-HCC).  Past Surgical History:  Past Surgical History:  Procedure Laterality Date  COLONOSCOPY 12/05/2014  Diverticulosis/Internal hemorrhoids/Otherwise normal/Repeat 85yrs (2026)/MGR  DESTRUCTION LOCALIZED LESION RETINA CRYOTHERAPY/DIATHERMY 12/09/2020  Dermatology Armida Sans, MD)  nose surgery   Family History: family history includes Bipolar disorder in his father; Coronary Artery Disease (Blocked arteries around heart) in his father; Heart disease in his father; High blood pressure (Hypertension) in his mother and another family member; Stroke in an other family member; Stroke (age of onset: 5) in his mother; Throat cancer in his paternal grandfather.  Social History: reports that he has never smoked. He has never used smokeless tobacco. He reports that he does not currently use alcohol. He reports that he does not use drugs.  Current Medications: has a current medication list which includes the following prescription(s): fluoxetine, lamotrigine, multivitamin, rosuvastatin, telmisartan-hydrochlorothiazide, fluoxetine, lamotrigine, and testosterone.  Allergies:  Allergies as of 11/02/2022  (No Known Allergies)   ROS:  A 15 point review of systems was performed and pertinent positives and negatives noted in HPI  Objective:    BP (!) 130/92  Pulse 84  Ht 182.9 cm (6')  Wt (!) 105.2 kg (232 lb)  BMI 31.46 kg/m   Constitutional : Alert, cooperative, no  distress  Lymphatics/Throat: Supple, no lymphadenopathy  Respiratory: clear to auscultation bilaterally  Cardiovascular: regular rate and rhythm  Gastrointestinal: soft, non-tender; bowel sounds normal; no masses, no organomegaly. umbilical hernia noted. moderate, overlying skin changes, and partially reducible  Musculoskeletal: Steady gait and movement  Skin: Cool and moist, no surgical scars  Psychiatric: Normal affect, non-agitated, not confused    LABS:  N/a   RADS: N/a Assessment:    Umbilical hernia with obstruction, without gangrene [K42.0]  Plan:    1. Umbilical hernia with obstruction, without gangrene [K42.0]  Discussed the risk of surgery including recurrence, which can be up to 50% in the case of incisional or complex hernias, possible use of prosthetic materials (mesh) and the increased risk of mesh infxn if used, bleeding, chronic pain, post-op infxn, post-op SBO or ileus, and possible re-operation to address said risks. The risks of general anesthetic, if used, includes MI, CVA, sudden death or even reaction to anesthetic medications also discussed. Alternatives include continued observation. Benefits include possible symptom relief, prevention of incarceration, strangulation, enlargement in size over time, and the risk of emergency surgery in the face of strangulation.   Typical post-op recovery time of 3-5 days with 2 weeks of activity restrictions were also discussed.  ED return precautions given for sudden increase in pain, size of hernia with accompanying fever, nausea, and/or vomiting.  The patient verbalized understanding and all questions were answered to the patient's satisfaction.  2. Patient has elected to proceed with surgical treatment. Procedure will be scheduled. robotic assisted laparoscopic  labs/images/medications/previous chart entries reviewed personally and relevant changes/updates noted above.

## 2023-06-11 NOTE — Discharge Instructions (Addendum)
Hernia repair, Care After This sheet gives you information about how to care for yourself after your procedure. Your health care provider may also give you more specific instructions. If you have problems or questions, contact your health care provider. What can I expect after the procedure? After your procedure, it is common to have the following: Pain in your abdomen, especially in the incision areas. You will be given medicine to control the pain. Tiredness. This is a normal part of the recovery process. Your energy level will return to normal over the next several weeks. Changes in your bowel movements, such as constipation or needing to go more often. Talk with your health care provider about how to manage this. Follow these instructions at home: Medicines  tylenol and advil as needed for discomfort.  Please alternate between the two every four hours as needed for pain.    Use narcotics, if prescribed, only when tylenol and motrin is not enough to control pain.  325-650mg every 8hrs to max of 3000mg/24hrs (including the 325mg in every norco dose) for the tylenol.    Advil up to 800mg per dose every 8hrs as needed for pain.   PLEASE RECORD NUMBER OF PILLS TAKEN UNTIL NEXT FOLLOW UP APPT.  THIS WILL HELP DETERMINE HOW READY YOU ARE TO BE RELEASED FROM ANY ACTIVITY RESTRICTIONS Do not drive or use heavy machinery while taking prescription pain medicine. Do not drink alcohol while taking prescription pain medicine.  Incision care    Follow instructions from your health care provider about how to take care of your incision areas. Make sure you: Keep your incisions clean and dry. Wash your hands with soap and water before and after applying medicine to the areas, and before and after changing your bandage (dressing). If soap and water are not available, use hand sanitizer. Change your dressing as told by your health care provider. Leave stitches (sutures), skin glue, or adhesive strips in  place. These skin closures may need to stay in place for 2 weeks or longer. If adhesive strip edges start to loosen and curl up, you may trim the loose edges. Do not remove adhesive strips completely unless your health care provider tells you to do that. Do not wear tight clothing over the incisions. Tight clothing may rub and irritate the incision areas, which may cause the incisions to open. Do not take baths, swim, or use a hot tub until your health care provider approves. OK TO SHOWER IN 24HRS.   Check your incision area every day for signs of infection. Check for: More redness, swelling, or pain. More fluid or blood. Warmth. Pus or a bad smell. Activity Avoid lifting anything that is heavier than 10 lb (4.5 kg) for 2 weeks or until your health care provider says it is okay. No pushing/pulling greater than 30lbs You may resume normal activities as told by your health care provider. Ask your health care provider what activities are safe for you. Take rest breaks during the day as needed. Eating and drinking Follow instructions from your health care provider about what you can eat after surgery. To prevent or treat constipation while you are taking prescription pain medicine, your health care provider may recommend that you: Drink enough fluid to keep your urine clear or pale yellow. Take over-the-counter or prescription medicines. Eat foods that are high in fiber, such as fresh fruits and vegetables, whole grains, and beans. Limit foods that are high in fat and processed sugars, such as fried and   sweet foods. General instructions Ask your health care provider when you will need an appointment to get your sutures or staples removed. Keep all follow-up visits as told by your health care provider. This is important. Contact a health care provider if: You have more redness, swelling, or pain around your incisions. You have more fluid or blood coming from the incisions. Your incisions feel  warm to the touch. You have pus or a bad smell coming from your incisions or your dressing. You have a fever. You have an incision that breaks open (edges not staying together) after sutures or staples have been removed. You develop a rash. You have chest pain or difficulty breathing. You have pain or swelling in your legs. You feel light-headed or you faint. Your abdomen swells (becomes distended). You have nausea or vomiting. You have blood in your stool (feces). This information is not intended to replace advice given to you by your health care provider. Make sure you discuss any questions you have with your health care provider. Document Released: 02/20/2005 Document Revised: 04/22/2018 Document Reviewed: 05/04/2016 Elsevier Interactive Patient Education  2019 [employer]   AMBULATORY SURGERY  DISCHARGE INSTRUCTIONS   The drugs that you were given will stay in your system until tomorrow so for the next 24 hours you should not:  Drive an automobile Make any legal decisions Drink any alcoholic beverage   You may resume regular meals tomorrow.  Today it is better to start with liquids and gradually work up to solid foods.  You may eat anything you prefer, but it is better to start with liquids, then soup and crackers, and gradually work up to solid foods.   Please notify your doctor immediately if you have any unusual bleeding, trouble breathing, redness and pain at the surgery site, drainage, fever, or pain not relieved by medication.    Additional Instructions:        Please contact your physician with any problems or Same Day Surgery at [phone], Monday through Friday 6 am to 4 pm, or [facility] [place] Main number at [phone].  

## 2023-06-11 NOTE — Anesthesia Procedure Notes (Signed)
Procedure Name: Intubation Date/Time: 06/11/2023 7:35 AM  Performed by: Mohammed Kindle, CRNAPre-anesthesia Checklist: Patient identified, Emergency Drugs available, Suction available and Patient being monitored Patient Re-evaluated:Patient Re-evaluated prior to induction Oxygen Delivery Method: Circle system utilized Preoxygenation: Pre-oxygenation with 100% oxygen Induction Type: IV induction Ventilation: Mask ventilation without difficulty Laryngoscope Size: McGraph and 3 Grade View: Grade I Tube type: Oral Tube size: 7.0 mm Number of attempts: 1 Airway Equipment and Method: Stylet and Oral airway Placement Confirmation: ETT inserted through vocal cords under direct vision, positive ETCO2, breath sounds checked- equal and bilateral and CO2 detector Secured at: 21 cm Tube secured with: Tape Dental Injury: Teeth and Oropharynx as per pre-operative assessment

## 2023-06-11 NOTE — Anesthesia Preprocedure Evaluation (Addendum)
Anesthesia Evaluation  Patient identified by MRN, date of birth, ID band Patient awake    Reviewed: Allergy & Precautions, NPO status , Patient's Chart, lab work & pertinent test results  History of Anesthesia Complications Negative for: history of anesthetic complications  Airway Mallampati: III  TM Distance: >3 FB Neck ROM: full    Dental no notable dental hx.    Pulmonary sleep apnea and Continuous Positive Airway Pressure Ventilation    Pulmonary exam normal        Cardiovascular hypertension, On Medications Normal cardiovascular exam     Neuro/Psych  PSYCHIATRIC DISORDERS Anxiety Depression    negative neurological ROS     GI/Hepatic Neg liver ROS,GERD  Controlled,,  Endo/Other  negative endocrine ROS    Renal/GU Renal disease     Musculoskeletal   Abdominal   Peds  Hematology negative hematology ROS (+)   Anesthesia Other Findings Past Medical History: No date: Achilles tendonitis No date: Anxiety No date: Asthma     Comment:  teenager 09/05/2019: Atypical mole     Comment:  R lat clavicle  No date: BPH without urinary obstruction No date: CKD (chronic kidney disease) stage 2, GFR 60-89 ml/min No date: Depression 06/27/2019: Dysplastic nevus     Comment:  right lateral clavicle dysplastic junctional lentiginous              nevus with severe atypia irritated limited margins free No date: Elevated cholesterol No date: GERD (gastroesophageal reflux disease) No date: Hypertension No date: Skin cancer No date: Sleep apnea  Past Surgical History: 2016: COLONOSCOPY No date: NOSE SURGERY No date: UPPER GASTROINTESTINAL ENDOSCOPY No date: WISDOM TOOTH EXTRACTION  BMI    Body Mass Index: 30.79 kg/m      Reproductive/Obstetrics negative OB ROS                             Anesthesia Physical Anesthesia Plan  ASA: 2  Anesthesia Plan: General ETT   Post-op Pain  Management: Tylenol PO (pre-op)*, Celebrex PO (pre-op)*, Gabapentin PO (pre-op)* and Dilaudid IV   Induction: Intravenous  PONV Risk Score and Plan: 2 and Ondansetron, Dexamethasone, Treatment may vary due to age or medical condition and Midazolam  Airway Management Planned: Oral ETT  Additional Equipment:   Intra-op Plan:   Post-operative Plan: Extubation in OR  Informed Consent: I have reviewed the patients History and Physical, chart, labs and discussed the procedure including the risks, benefits and alternatives for the proposed anesthesia with the patient or authorized representative who has indicated his/her understanding and acceptance.     Dental Advisory Given  Plan Discussed with: Anesthesiologist, CRNA and Surgeon  Anesthesia Plan Comments: (Patient consented for risks of anesthesia including but not limited to:  - adverse reactions to medications - damage to eyes, teeth, lips or other oral mucosa - nerve damage due to positioning  - sore throat or hoarseness - Damage to heart, brain, nerves, lungs, other parts of body or loss of life  Patient voiced understanding and assent.)       Anesthesia Quick Evaluation

## 2023-06-11 NOTE — Interval H&P Note (Signed)
No change. OK to proceed.

## 2023-06-11 NOTE — Op Note (Signed)
Preoperative diagnosis: Umbilical initial incarcerated hernia Postoperative diagnosis: same  Procedure: Robotic assisted laparoscopic incarcerated umbilical hernia repair with mesh  Anesthesia: general  Surgeon: Sung Amabile  Wound Classification: Clean  Specimen: none  Complications: None  Estimated Blood Loss: 10ml  Indications:see HPI  Findings: Incarcerated umbilical hernia defect measuring 2 cm x 2.5 cm 4. Tension free repair achieved with ProGrip mesh and suture 5. Adequate hemostasis  Description of procedure: The patient was brought to the operating room and general anesthesia was induced. A time-out was completed verifying correct patient, procedure, site, positioning, and implant(s) and/or special equipment prior to beginning this procedure. Antibiotics were administered prior to making the incision. SCDs placed. The anterior abdominal wall was prepped and draped in the standard sterile fashion.   Palmer's point chosen for entry.  Veress needle placed and abdomen insufflated to 15cm without any dramatic increase in pressure.  Needle removed and optiview technique used to place 5mm port at same point.  No injury noted during placement. 3 additional ports, 8mm x2 and 12mm, along left lateral aspect placed.   Xi robot then docked into place.  Umbilical hernia defect measuring 2 cm x 2.5 was noted.  Preperitoneal plane was entered by making a incision along the right lateral aspect of the peritoneum.  This flap was carried across the abdomen to the other side of the defect, reducing all hernia contents and preperitoneal lipoma within the hernia defect.  Small amounts of bleeding was controlled with cautery.  Once adequate exposure of the defect and adequate space was created to place the mesh, insufflation dropped to 8mm and transfacial suture with 0 stratafix used to primarily close defect under minimal tension.  Overlying skin was tacked with suture to secure umbilical stalk to  fascia.   ProGrip mesh cut to size with adequate overlap around the defect edges was placed within the abdominal cavity through 12mm port and secured to the abdominal wall centered over the defect. The peritoneal flap was then closed with a running 3-0 V lock.  Small peritoneal tear on the right side was closed with 3 oh V-Loc to ensure full coverage of the ProGrip mesh separate from the bowel contents.  Robot was undocked.  The 12mm cannula was removed and port site was closed using PMI device and 0 vicryl suture, ensuring no bowels were injured during this process.  Abdomen then desufflated while camera within abdomen to ensure no signs of new bleed prior to removing camera and rest of ports completely.  All skin incisions closed with runninrg 4-0 Monocryl in a subcuticular fashion.  All wounds then dressed with Dermabond.  Patient was then successfully awakened and transferred to PACU in stable condition.  At the end of the procedure sponge and instrument counts were correct.

## 2023-06-11 NOTE — Anesthesia Postprocedure Evaluation (Signed)
Anesthesia Post Note  Patient: Blake Strickland  Procedure(s) Performed: XI ROBOT ASSISTED UMBILICAL HERNIA REPAIR w/ mesh (Abdomen)  Patient location during evaluation: PACU Anesthesia Type: General Level of consciousness: awake and alert Pain management: pain level controlled Vital Signs Assessment: post-procedure vital signs reviewed and stable Respiratory status: spontaneous breathing, nonlabored ventilation, respiratory function stable and patient connected to nasal cannula oxygen Cardiovascular status: blood pressure returned to baseline and stable Postop Assessment: no apparent nausea or vomiting Anesthetic complications: no   No notable events documented.   Last Vitals:  Vitals:   06/11/23 0945 06/11/23 0952  BP: 108/74   Pulse: 89 96  Resp: 10 10  Temp:  36.7 C  SpO2: 96% 90%    Last Pain:  Vitals:   06/11/23 0945  TempSrc:   PainSc: 0-No pain                 Louie Boston

## 2023-06-11 NOTE — Transfer of Care (Signed)
Immediate Anesthesia Transfer of Care Note  Patient: Blake Strickland  Procedure(s) Performed: XI ROBOT ASSISTED UMBILICAL HERNIA REPAIR w/ mesh (Abdomen)  Patient Location: PACU  Anesthesia Type:General  Level of Consciousness: drowsy and patient cooperative  Airway & Oxygen Therapy: Patient Spontanous Breathing and Patient connected to face mask oxygen  Post-op Assessment: Report given to RN and Post -op Vital signs reviewed and stable  Post vital signs: Reviewed and stable  Last Vitals:  Vitals Value Taken Time  BP    Temp    Pulse 93 06/11/23 0925  Resp 27 06/11/23 0925  SpO2 94 % 06/11/23 0925  Vitals shown include unfiled device data.  Last Pain:  Vitals:   06/11/23 0637  TempSrc: Tympanic  PainSc: 0-No pain         Complications: No notable events documented.

## 2023-10-28 ENCOUNTER — Emergency Department

## 2023-10-28 ENCOUNTER — Other Ambulatory Visit: Payer: Self-pay

## 2023-10-28 ENCOUNTER — Emergency Department
Admission: EM | Admit: 2023-10-28 | Discharge: 2023-10-28 | Disposition: A | Discharge status: Home / Self Care | Attending: Emergency Medicine | Admitting: Emergency Medicine

## 2023-10-28 DIAGNOSIS — R0789 Other chest pain: Secondary | ICD-10-CM | POA: Insufficient documentation

## 2023-10-28 DIAGNOSIS — S2242XA Multiple fractures of ribs, left side, initial encounter for closed fracture: Secondary | ICD-10-CM | POA: Insufficient documentation

## 2023-10-28 DIAGNOSIS — Y92410 Unspecified street and highway as the place of occurrence of the external cause: Secondary | ICD-10-CM | POA: Diagnosis not present

## 2023-10-28 DIAGNOSIS — S299XXA Unspecified injury of thorax, initial encounter: Secondary | ICD-10-CM | POA: Diagnosis present

## 2023-10-28 DIAGNOSIS — S20212A Contusion of left front wall of thorax, initial encounter: Secondary | ICD-10-CM | POA: Insufficient documentation

## 2023-10-28 DIAGNOSIS — M25552 Pain in left hip: Secondary | ICD-10-CM | POA: Insufficient documentation

## 2023-10-28 LAB — CBC WITH DIFFERENTIAL/PLATELET
Abs Immature Granulocytes: 0.02 10*3/uL (ref 0.00–0.07)
Basophils Absolute: 0 10*3/uL (ref 0.0–0.1)
Basophils Relative: 1 %
Eosinophils Absolute: 0.1 10*3/uL (ref 0.0–0.5)
Eosinophils Relative: 2 %
HCT: 41 % (ref 39.0–52.0)
Hemoglobin: 14.3 g/dL (ref 13.0–17.0)
Immature Granulocytes: 0 %
Lymphocytes Relative: 25 %
Lymphs Abs: 1.6 10*3/uL (ref 0.7–4.0)
MCH: 29.7 pg (ref 26.0–34.0)
MCHC: 34.9 g/dL (ref 30.0–36.0)
MCV: 85.1 fL (ref 80.0–100.0)
Monocytes Absolute: 0.6 10*3/uL (ref 0.1–1.0)
Monocytes Relative: 10 %
Neutro Abs: 4.1 10*3/uL (ref 1.7–7.7)
Neutrophils Relative %: 62 %
Platelets: 229 10*3/uL (ref 150–400)
RBC: 4.82 MIL/uL (ref 4.22–5.81)
RDW: 13.8 % (ref 11.5–15.5)
WBC: 6.6 10*3/uL (ref 4.0–10.5)
nRBC: 0 % (ref 0.0–0.2)

## 2023-10-28 LAB — BASIC METABOLIC PANEL
Anion gap: 12 (ref 5–15)
BUN: 17 mg/dL (ref 8–23)
CO2: 25 mmol/L (ref 22–32)
Calcium: 8.7 mg/dL — ABNORMAL LOW (ref 8.9–10.3)
Chloride: 102 mmol/L (ref 98–111)
Creatinine, Ser: 1.04 mg/dL (ref 0.61–1.24)
GFR, Estimated: >60 mL/min (ref >60)
Glucose, Bld: 107 mg/dL — ABNORMAL HIGH (ref 70–99)
Potassium: 3.9 mmol/L (ref 3.5–5.1)
Sodium: 139 mmol/L (ref 135–145)

## 2023-10-28 LAB — TROPONIN I (HIGH SENSITIVITY): Troponin I (High Sensitivity): 5 ng/L (ref <18)

## 2023-10-28 MED ORDER — OXYCODONE HCL 5 MG PO TABS: 5.0000 mg | ORAL_TABLET | Freq: Four times a day (QID) | ORAL | 0 refills | Status: AC | PRN

## 2023-10-28 MED ORDER — LIDOCAINE 5 % EX PTCH: 1.0000 | MEDICATED_PATCH | Freq: Two times a day (BID) | CUTANEOUS | 0 refills | Status: AC

## 2023-10-28 MED ORDER — OXYCODONE HCL 5 MG PO TABS: 5.0000 mg | ORAL_TABLET | Freq: Once | ORAL | Status: DC

## 2023-10-28 MED ORDER — LIDOCAINE 5 % EX PTCH
1.0000 | MEDICATED_PATCH | Freq: Once | CUTANEOUS | Status: DC
  Administered 2023-10-28: 1 via TRANSDERMAL
  Filled 2023-10-28: qty 1

## 2023-10-28 MED ORDER — KETOROLAC TROMETHAMINE 30 MG/ML IJ SOLN: 30.0000 mg | Freq: Once | INTRAMUSCULAR | Status: DC
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
## 2023-10-28 NOTE — ED Notes (Signed)
 Checked on pt. Pt is comfortable. States lidocaine patch helped.

## 2023-10-28 NOTE — Discharge Instructions (Addendum)
 It is important to control your pain to help make sure that you have adequate breathing.  You can take Tylenol 1 g every 8 hours, ibuprofen given your history of prior kidney issues you can do a reduced dose such as 400 mg every 8 hours, oxycodone for breakthrough pain, lidocaine patches.  Use the incentive spirometer to help keep lungs open.  Return to the ER for worsening shortness of breath or any other concerns  IMPRESSION:  1. Acute nondisplaced lateral left fifth rib fracture.  2. Questionable nondisplaced lateral left seventh rib fracture.   Take oxycodone as prescribed. Do not drink alcohol, drive or participate in any other potentially dangerous activities while taking this medication as it may make you sleepy. Do not take this medication with any other sedating medications, either prescription or over-the-counter. If you were prescribed Percocet or Vicodin, do not take these with acetaminophen (Tylenol) as it is already contained within these medications.  This medication is an opiate (or narcotic) pain medication and can be habit forming. Use it as little as possible to achieve adequate pain control. Do not use or use it with extreme caution if you have a history of opiate abuse or dependence. If you are on a pain contract with your primary care doctor or a pain specialist, be sure to let them know you were prescribed this medication today from the Emergency Department. This medication is intended for your use only - do not give any to anyone else and keep it in a secure place where nobody else, especially children, have access to it.
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
## 2023-10-28 NOTE — ED Notes (Signed)
 Pt to xray

## 2023-10-28 NOTE — ED Notes (Signed)
 PT teaching done about incentive spirometer use.

## 2023-10-28 NOTE — ED Notes (Signed)
 Introduced self to pt, hooked pt up to bedside vital machine, spouse at bedside.

## 2023-12-29 ENCOUNTER — Ambulatory Visit: Payer: Self-pay | Admitting: Urology

## 2023-12-29 ENCOUNTER — Other Ambulatory Visit: Payer: Self-pay | Admitting: Urology

## 2023-12-29 ENCOUNTER — Encounter: Payer: Self-pay | Admitting: Urology

## 2023-12-29 VITALS — BP 135/83 | HR 86 | Ht 72.0 in | Wt 225.0 lb

## 2023-12-29 DIAGNOSIS — R350 Frequency of micturition: Secondary | ICD-10-CM | POA: Diagnosis not present

## 2023-12-29 DIAGNOSIS — R3914 Feeling of incomplete bladder emptying: Secondary | ICD-10-CM | POA: Diagnosis not present

## 2023-12-29 DIAGNOSIS — N401 Enlarged prostate with lower urinary tract symptoms: Secondary | ICD-10-CM

## 2023-12-29 DIAGNOSIS — Z125 Encounter for screening for malignant neoplasm of prostate: Secondary | ICD-10-CM

## 2023-12-29 LAB — BLADDER SCAN AMB NON-IMAGING: Scan Result: 209

## 2023-12-29 MED ORDER — ALFUZOSIN HCL ER 10 MG PO TB24: 10.0000 mg | ORAL_TABLET | Freq: Every day | ORAL | 1 refills | Status: DC

## 2023-12-29 NOTE — Progress Notes (Signed)
 I, Maysun Jamey Mccallum, acting as a Neurosurgeon for Geraline Knapp, MD., have documented all relevant documentation on the behalf of Geraline Knapp, MD, as directed by Geraline Knapp, MD while in the presence of Geraline Knapp, MD.  Discussed the use of AI scribe software for clinical note transcription with the patient, who gave verbal consent to proceed.   12/29/2023 4:51 PM   Blake Strickland Feb 20, 1961 [id number]  Referring provider: Lyle San, MD [address] [place],  [place] [address]  Chief Complaint  Patient presents with   Benign Prostatic Hypertrophy   Urologic history: 1.  BPH with LUTS   2.  Hypogonadism -Previously on testosterone  and Clomid   HPI: Blake Strickland is a 63 y.o. male presents for annual follow-up.  No problem since last visit Stable lower urinary tract symptoms. Currently not on BPH medication.  Previously had dryness of the mouth with tamsulosin . PSA 07/07/2023 was 0.22   PMH: Past Medical History:  Diagnosis Date   Achilles tendonitis    Anxiety    Asthma    teenager   Atypical mole 09/05/2019   R lat clavicle    BPH without urinary obstruction    CKD (chronic kidney disease) stage 2, GFR 60-89 ml/min    Depression    Dysplastic nevus 06/27/2019   right lateral clavicle dysplastic junctional lentiginous nevus with severe atypia irritated limited margins free   Elevated cholesterol    GERD (gastroesophageal reflux disease)    Hypertension    Skin cancer    Sleep apnea     Surgical History: Past Surgical History:  Procedure Laterality Date   COLONOSCOPY  2016   NOSE SURGERY     UPPER GASTROINTESTINAL ENDOSCOPY     WISDOM TOOTH EXTRACTION      Home Medications:  Allergies as of 12/29/2023       Reactions   Levaquin [levofloxacin] Other (See Comments)   Tendon problems   Nsaids Other (See Comments)   CKD   Other Other (See Comments)   Intolerance to eggs - "sinus drainage" Intolerance to eggs - "sinus  drainage"        Medication List        Accurate as of Dec 29, 2023  4:51 PM. If you have any questions, ask your nurse or doctor.          alfuzosin  10 MG 24 hr tablet Commonly known as: UROXATRAL  Take 1 tablet (10 mg total) by mouth daily with breakfast. Started by: Geraline Knapp   Caplyta 10.5 MG capsule Generic drug: lumateperone tosylate Take 42 mg by mouth daily.   DEPLIN PO Take 15 mg by mouth daily.   FLUoxetine 40 MG capsule Commonly known as: PROZAC Take 40 mg by mouth daily.   HYDROcodone -acetaminophen  5-325 MG tablet Commonly known as: Norco Take 1 tablet by mouth every 6 (six) hours as needed for up to 6 doses for moderate pain (pain score 4-6).   lamoTRIgine 200 MG tablet Commonly known as: LAMICTAL Take 200 mg by mouth daily.   rosuvastatin 10 MG tablet Commonly known as: CRESTOR Take 10 mg by mouth at bedtime.   telmisartan-hydrochlorothiazide 80-12.5 MG tablet Commonly known as: MICARDIS HCT Take 1 tablet by mouth daily.        Allergies:  Allergies  Allergen Reactions   Levaquin [Levofloxacin] Other (See Comments)    Tendon problems   Nsaids Other (See Comments)    CKD  Other Other (See Comments)    Intolerance to eggs - "sinus drainage" Intolerance to eggs - "sinus drainage"     Social History:  reports that he has never smoked. He has never used smokeless tobacco. He reports that he does not currently use alcohol. He reports that he does not use drugs.   Physical Exam: BP 135/83   Pulse 86   Ht 6' (1.829 m)   Wt 225 lb (102.1 kg)   BMI 30.52 kg/m   Constitutional:  Alert and oriented, No acute distress. HEENT: [place] AT Respiratory: Normal respiratory effort, no increased work of breathing. GU: Prostate 40 grams, smooth without nodules.  Skin: No rashes, bruises or suspicious lesions. Psychiatric: Normal mood and affect.   Assessment & Plan:    1. BPH with incomplete bladder emptying.  PVR today 209 mL (109 last  year) I did recommend a 6 week alpha blocker trial, and Rx alfuzosin  was sent to pharmacy.  6 week follow-up for bladder scan. Continue annual follow-up   2. Prostate cancer screening. He had a genetic marker, which put him at above-average risk for prostate cancer. His PSA is low and stable, and DRE remains benign.  Continue annual follow-up.  I have reviewed the above documentation for accuracy and completeness, and I agree with the above.   Geraline Knapp, MD  [facility] [address], [address] [place], [place] [address] [phone]

## 2024-01-19 ENCOUNTER — Encounter: Payer: Self-pay | Admitting: Dermatology

## 2024-01-19 ENCOUNTER — Ambulatory Visit: Payer: BC Managed Care – PPO | Admitting: Dermatology

## 2024-01-19 DIAGNOSIS — L821 Other seborrheic keratosis: Secondary | ICD-10-CM | POA: Diagnosis not present

## 2024-01-19 DIAGNOSIS — L814 Other melanin hyperpigmentation: Secondary | ICD-10-CM | POA: Diagnosis not present

## 2024-01-19 DIAGNOSIS — D492 Neoplasm of unspecified behavior of bone, soft tissue, and skin: Secondary | ICD-10-CM

## 2024-01-19 DIAGNOSIS — Z86018 Personal history of other benign neoplasm: Secondary | ICD-10-CM

## 2024-01-19 DIAGNOSIS — Z1283 Encounter for screening for malignant neoplasm of skin: Secondary | ICD-10-CM | POA: Diagnosis not present

## 2024-01-19 DIAGNOSIS — W908XXA Exposure to other nonionizing radiation, initial encounter: Secondary | ICD-10-CM

## 2024-01-19 DIAGNOSIS — L918 Other hypertrophic disorders of the skin: Secondary | ICD-10-CM

## 2024-01-19 DIAGNOSIS — L578 Other skin changes due to chronic exposure to nonionizing radiation: Secondary | ICD-10-CM | POA: Diagnosis not present

## 2024-01-19 DIAGNOSIS — D229 Melanocytic nevi, unspecified: Secondary | ICD-10-CM

## 2024-01-19 DIAGNOSIS — D2271 Melanocytic nevi of right lower limb, including hip: Secondary | ICD-10-CM

## 2024-01-19 NOTE — Progress Notes (Signed)
 Follow-Up Visit   Subjective  Blake Strickland is a 63 y.o. male who presents for the following: Skin Cancer Screening and Full Body Skin Exam. Hx of dysplastic nevus. No personal Hx of skin cancer.   The patient presents for Total-Body Skin Exam (TBSE) for skin cancer screening and mole check. The patient has spots, moles and lesions to be evaluated, some may be new or changing and the patient may have concern these could be cancer.  The following portions of the chart were reviewed this encounter and updated as appropriate: medications, allergies, medical history  Review of Systems:  No other skin or systemic complaints except as noted in HPI or Assessment and Plan.  Objective  Well appearing patient in no apparent distress; mood and affect are within normal limits.  A full examination was performed including scalp, head, eyes, ears, nose, lips, neck, chest, axillae, abdomen, back, buttocks, bilateral upper extremities, bilateral lower extremities, hands, feet, fingers, toes, fingernails, and toenails. All findings within normal limits unless otherwise noted below.   Relevant physical exam findings are noted in the Assessment and Plan.      Left Proximal Thigh - Anterior 0.6 mm dark brown macule   Assessment & Plan   SKIN CANCER SCREENING PERFORMED TODAY.  HISTORY OF DYSPLASTIC NEVUS. Right lateral clavicle. Severe atypia. 2020/2021 No evidence of recurrence today Recommend regular full body skin exams Recommend daily broad spectrum sunscreen SPF 30+ to sun-exposed areas, reapply every 2 hours as needed.  Call if any new or changing lesions are noted between office visits  ACTINIC DAMAGE - Chronic condition, secondary to cumulative UV/sun exposure - diffuse scaly erythematous macules with underlying dyspigmentation - Recommend daily broad spectrum sunscreen SPF 30+ to sun-exposed areas, reapply every 2 hours as needed.  - Staying in the shade or wearing long sleeves, sun  glasses (UVA+UVB protection) and wide brim hats (4-inch brim around the entire circumference of the hat) are also recommended for sun protection.  - Call for new or changing lesions.  LENTIGINES, SEBORRHEIC KERATOSES, HEMANGIOMAS - Benign normal skin lesions - Benign-appearing - Call for any changes  MELANOCYTIC NEVI - Tan-brown and/or pink-flesh-colored symmetric macules and papules - 3 mm regular macule at R dorsum foot. Photo taken today - Benign appearing on exam today - Observation - Call clinic for new or changing moles - Recommend daily use of broad spectrum spf 30+ sunscreen to sun-exposed areas.   Acrochordons (Skin Tags) - Fleshy, skin-colored pedunculated papule at right upper eyelid - Benign appearing.  - Observe. - If desired, they can be removed with an in office procedure that is not covered by insurance. - Please call the clinic if you notice any new or changing lesions.   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 - Benign, observe - Call for any changes   NEOPLASM OF SKIN Left Proximal Thigh - Anterior Epidermal / dermal shaving  Lesion diameter (cm):  0.6 Informed consent: discussed and consent obtained   Timeout: patient name, date of birth, surgical site, and procedure verified   Procedure prep:  Patient was prepped and draped in usual sterile fashion Prep type:  Isopropyl alcohol Anesthesia: the lesion was anesthetized in a standard fashion   Anesthetic:  1% lidocaine  w/ epinephrine  1-100,000 buffered w/ 8.4% NaHCO3 Instrument used: flexible razor blade   Hemostasis achieved with: pressure, aluminum chloride and electrodesiccation   Outcome: patient tolerated procedure well   Post-procedure details: sterile  dressing applied and wound care instructions given   Dressing type: bandage and petrolatum   Specimen 1 - Surgical pathology Differential Diagnosis: Nevus, R/O dysplastic nevus  Check Margins:  Yes Return in about 1 year (around 01/18/2025) for TBSE, HxDN.  I, Jill Parcell, CMA, am acting as scribe for Celine Collard, MD.   Documentation: I have reviewed the above documentation for accuracy and completeness, and I agree with the above.  Celine Collard, MD

## 2024-01-19 NOTE — Patient Instructions (Addendum)
 Wound Care Instructions  Cleanse wound gently with soap and water once a day then pat dry with clean gauze. Apply a thin coat of Petrolatum (petroleum jelly, "Vaseline") over the wound (unless you have an allergy to this). We recommend that you use a new, sterile tube of Vaseline. Do not pick or remove scabs. Do not remove the yellow or white "healing tissue" from the base of the wound.  Cover the wound with fresh, clean, nonstick gauze and secure with paper tape. You may use Band-Aids in place of gauze and tape if the wound is small enough, but would recommend trimming much of the tape off as there is often too much. Sometimes Band-Aids can irritate the skin.  You should call the office for your biopsy report after 1 week if you have not already been contacted.  If you experience any problems, such as abnormal amounts of bleeding, swelling, significant bruising, significant pain, or evidence of infection, please call the office immediately.  FOR ADULT SURGERY PATIENTS: If you need something for pain relief you may take 1 extra strength Tylenol (acetaminophen) AND 2 Ibuprofen (200mg  each) together every 4 hours as needed for pain. (do not take these if you are allergic to them or if you have a reason you should not take them.) Typically, you may only need pain medication for 1 to 3 days.       Recommend daily broad spectrum sunscreen SPF 30+ to sun-exposed areas, reapply every 2 hours as needed. Call for new or changing lesions.  Staying in the shade or wearing long sleeves, sun glasses (UVA+UVB protection) and wide brim hats (4-inch brim around the entire circumference of the hat) are also recommended for sun protection.    Melanoma ABCDEs  Melanoma is the most dangerous type of skin cancer, and is the leading cause of death from skin disease.  You are more likely to develop melanoma if you: Have light-colored skin, light-colored eyes, or red or blond hair Spend a lot of time in the sun Tan  regularly, either outdoors or in a tanning bed Have had blistering sunburns, especially during childhood Have a close family member who has had a melanoma Have atypical moles or large birthmarks  Early detection of melanoma is key since treatment is typically straightforward and cure rates are extremely high if we catch it early.   The first sign of melanoma is often a change in a mole or a new dark spot.  The ABCDE system is a way of remembering the signs of melanoma.  A for asymmetry:  The two halves do not match. B for border:  The edges of the growth are irregular. C for color:  A mixture of colors are present instead of an even brown color. D for diameter:  Melanomas are usually (but not always) greater than 6mm - the size of a pencil eraser. E for evolution:  The spot keeps changing in size, shape, and color.  Please check your skin once per month between visits. You can use a small mirror in front and a large mirror behind you to keep an eye on the back side or your body.   If you see any new or changing lesions before your next follow-up, please call to schedule a visit.  Please continue daily skin protection including broad spectrum sunscreen SPF 30+ to sun-exposed areas, reapplying every 2 hours as needed when you're outdoors.   Staying in the shade or wearing long sleeves, sun glasses (UVA+UVB protection) and  wide brim hats (4-inch brim around the entire circumference of the hat) are also recommended for sun protection.     Due to recent changes in healthcare laws, you may see results of your pathology and/or laboratory studies on MyChart before the doctors have had a chance to review them. We understand that in some cases there may be results that are confusing or concerning to you. Please understand that not all results are received at the same time and often the doctors may need to interpret multiple results in order to provide you with the best plan of care or course of  treatment. Therefore, we ask that you please give [place] 2 business days to thoroughly review all your results before contacting the office for clarification. Should we see a critical lab result, you will be contacted sooner.   If You Need Anything After Your Visit  If you have any questions or concerns for your doctor, please call our main line at [phone] and press option 4 to reach your doctor's medical assistant. If no one answers, please leave a voicemail as directed and we will return your call as soon as possible. Messages left after 4 pm will be answered the following business day.   You may also send [place] a message via MyChart. We typically respond to MyChart messages within 1-2 business days.  For prescription refills, please ask your pharmacy to contact our office. Our fax number is [phone].  If you have an urgent issue when the clinic is closed that cannot wait until the next business day, you can page your doctor at the number below.    Please note that while we do our best to be available for urgent issues outside of office hours, we are not available 24/7.   If you have an urgent issue and are unable to reach [place], you may choose to seek medical care at your doctor's office, retail clinic, urgent care center, or emergency room.  If you have a medical emergency, please immediately call 911 or go to the emergency department.  Pager Numbers  - Dr. Gwen Pounds: [phone]  - Dr. Roseanne Reno: [phone]  - Dr. Katrinka Blazing: [phone]   In the event of inclement weather, please call our main line at [phone] for an update on the status of any delays or closures.  Dermatology Medication Tips: Please keep the boxes that topical medications come in in order to help keep track of the instructions about where and how to use these. Pharmacies typically print the medication instructions only on the boxes and not directly on the medication tubes.   If your medication is too expensive,  please contact our office at [phone] option 4 or send [place] a message through MyChart.   We are unable to tell what your co-pay for medications will be in advance as this is different depending on your insurance coverage. However, we may be able to find a substitute medication at lower cost or fill out paperwork to get insurance to cover a needed medication.   If a prior authorization is required to get your medication covered by your insurance company, please allow [place] 1-2 business days to complete this process.  Drug prices often vary depending on where the prescription is filled and some pharmacies may offer cheaper prices.  The website www.goodrx.com contains coupons for medications through different pharmacies. The prices here do not account for what the cost may be with help from insurance (it may be cheaper with your insurance), but the website  can give you the price if you did not use any insurance.  - You can print the associated coupon and take it with your prescription to the pharmacy.  - You may also stop by our office during regular business hours and pick up a GoodRx coupon card.  - If you need your prescription sent electronically to a different pharmacy, notify our office through [facility] or by phone at [phone] option 4.     Si Usted Necesita Algo Despus de Su Visita  Tambin puede enviarnos un mensaje a travs de Clinical cytogeneticist. Por lo general respondemos a los mensajes de MyChart en el transcurso de 1 a 2 das hbiles.  Para renovar recetas, por favor pida a su farmacia que se ponga en contacto con nuestra oficina. Annie Sable de fax es [place] [phone].  Si tiene un asunto urgente cuando la clnica est cerrada y que no puede esperar hasta el siguiente da hbil, puede llamar/localizar a su doctor(a) al nmero que aparece a continuacin.   Por favor, tenga en cuenta que aunque hacemos todo lo posible para estar disponibles para asuntos urgentes fuera del  horario de [employer], no estamos disponibles las 24 horas del da, los 7 [address]  [address] de la [place].   Si tiene un problema urgente y no puede comunicarse con nosotros, puede optar por buscar atencin mdica  en el consultorio de su doctor(a), en una clnica privada, en un centro de atencin urgente o en una sala de emergencias.  Si tiene Engineer, drilling, por favor llame inmediatamente al 911 o vaya a la sala de emergencias.  Nmeros de bper  - Dr. Gwen Pounds: [phone]  - Dra. Roseanne Reno: [phone]  - Dr. Katrinka Blazing: [phone]   En caso de inclemencias del tiempo, por favor llame a Lacy Duverney principal al [phone] para una actualizacin sobre el [place] de cualquier retraso o cierre.  Consejos para la medicacin en dermatologa: Por favor, guarde las cajas en las que vienen los medicamentos de uso tpico para ayudarle a seguir las instrucciones sobre dnde y cmo usarlos. Las farmacias generalmente imprimen las instrucciones del medicamento slo en las cajas y no directamente en los tubos del [place].   Si su medicamento es muy caro, por favor, pngase en contacto con Rolm Gala llamando al [phone] y presione la opcin 4 o envenos un mensaje a travs de Clinical cytogeneticist.   No podemos decirle cul ser su copago por los medicamentos por adelantado ya que esto es diferente dependiendo de la cobertura de su seguro. Sin embargo, es posible que podamos encontrar un medicamento sustituto a Audiological scientist un formulario para que el seguro cubra el medicamento que se considera necesario.   Si se requiere una autorizacin previa para que su compaa de seguros [place] su medicamento, por favor permtanos de 1 a 2 das hbiles para completar [address]  Los precios de los medicamentos varan con frecuencia dependiendo del Environmental consultant de dnde se surte la receta y alguna farmacias pueden ofrecer precios ms baratos.  El sitio web www.goodrx.com tiene cupones para medicamentos de Engineer, civil (consulting). Los precios aqu no tienen en cuenta lo que podra costar con la ayuda del seguro (puede ser ms barato con su seguro), pero el sitio web puede darle el precio si no utiliz Tourist information centre manager.  - Puede imprimir el cupn correspondiente y llevarlo con su receta a la farmacia.  - Tambin puede pasar por nuestra oficina durante el horario de atencin regular y Education officer, museum una tarjeta de cupones de GoodRx.  -  Si necesita que su receta se enve electrnicamente a Psychiatrist, informe a nuestra oficina a travs de MyChart de [place] o por telfono llamando al [phone] y presione la opcin 4.

## 2024-01-21 LAB — SURGICAL PATHOLOGY

## 2024-01-24 ENCOUNTER — Ambulatory Visit: Payer: Self-pay | Admitting: Dermatology

## 2024-01-25 NOTE — Telephone Encounter (Addendum)
 Tried calling patient regarding results. No answer. LM for patient to return call.   ----- Message from Celine Collard sent at 01/24/2024  8:10 PM EDT ----- FINAL DIAGNOSIS        1. Skin, left proximal thigh - anterior :       SOLAR LENTIGO AND EARLY SEBORRHEIC KERATOSIS   Benign freckle with keratosis No further treatment needed

## 2024-01-26 NOTE — Telephone Encounter (Addendum)
 Tried calling patient regarding results. No answer. LM for patient to return call. ----- Message from Celine Collard sent at 01/24/2024  8:10 PM EDT ----- FINAL DIAGNOSIS        1. Skin, left proximal thigh - anterior :       SOLAR LENTIGO AND EARLY SEBORRHEIC KERATOSIS   Benign freckle with keratosis No further treatment needed

## 2024-01-26 NOTE — Telephone Encounter (Signed)
 Patient advised of BX results. aw

## 2024-02-09 ENCOUNTER — Ambulatory Visit: Admitting: Physician Assistant

## 2024-06-29 ENCOUNTER — Ambulatory Visit: Admitting: Physician Assistant

## 2024-07-04 ENCOUNTER — Ambulatory Visit: Admitting: Physician Assistant

## 2024-07-04 ENCOUNTER — Other Ambulatory Visit: Payer: Self-pay | Admitting: Orthopedic Surgery

## 2024-07-04 DIAGNOSIS — M25511 Pain in right shoulder: Secondary | ICD-10-CM

## 2024-07-18 ENCOUNTER — Ambulatory Visit: Admitting: Physician Assistant

## 2024-07-18 ENCOUNTER — Other Ambulatory Visit: Payer: Self-pay | Admitting: Physician Assistant

## 2024-07-18 VITALS — BP 147/95 | HR 96 | Ht 72.0 in | Wt 239.0 lb

## 2024-07-18 DIAGNOSIS — R3914 Feeling of incomplete bladder emptying: Secondary | ICD-10-CM | POA: Diagnosis not present

## 2024-07-18 DIAGNOSIS — N401 Enlarged prostate with lower urinary tract symptoms: Secondary | ICD-10-CM | POA: Diagnosis not present

## 2024-07-18 LAB — BLADDER SCAN AMB NON-IMAGING

## 2024-07-18 MED ORDER — ALFUZOSIN HCL ER 10 MG PO TB24: 10.0000 mg | ORAL_TABLET | Freq: Every day | ORAL | 3 refills | Status: AC

## 2024-07-18 MED ORDER — ALFUZOSIN HCL ER 10 MG PO TB24: 10.0000 mg | ORAL_TABLET | Freq: Every day | ORAL | 0 refills | Status: AC

## 2024-07-23 NOTE — Progress Notes (Signed)
 07/18/2024 9:38 PM   Blake Strickland 07-14-1961 [id number]  CC: Chief Complaint  Patient presents with   Follow-up   Benign Prostatic Hypertrophy   HPI: Blake Strickland is a 63 y.o. male with PMH BPH with incomplete bladder emptying who failed tamsulosin  due to dry mouth and hypogonadism previously on TRT and Clomid  who presents today for follow-up on alfuzosin .   Today he reports he did very well on alfuzosin  and tolerated it without side effects.  He ran out of the medication sometime ago and requests a refill today.  PVR 277 mL.  PMH: Past Medical History:  Diagnosis Date   Achilles tendonitis    Anxiety    Asthma    teenager   Atypical mole 09/05/2019   R lat clavicle    BPH without urinary obstruction    CKD (chronic kidney disease) stage 2, GFR 60-89 ml/min    Depression    Dysplastic nevus 06/27/2019   right lateral clavicle dysplastic junctional lentiginous nevus with severe atypia irritated limited margins free   Elevated cholesterol    GERD (gastroesophageal reflux disease)    Hypertension    Skin cancer    Sleep apnea     Surgical History: Past Surgical History:  Procedure Laterality Date   COLONOSCOPY  2016   NOSE SURGERY     UPPER GASTROINTESTINAL ENDOSCOPY     WISDOM TOOTH EXTRACTION      Home Medications:  Allergies as of 07/18/2024       Reactions   Levaquin [levofloxacin] Other (See Comments)   Tendon problems   Nsaids Other (See Comments)   CKD   Other Other (See Comments)   Intolerance to eggs - sinus drainage Intolerance to eggs - sinus drainage        Medication List        Accurate as of July 18, 2024 11:59 PM. If you have any questions, ask your nurse or doctor.          alfuzosin  10 MG 24 hr tablet Commonly known as: UROXATRAL  Take 1 tablet (10 mg total) by mouth daily with breakfast. What changed: Another medication with the same name was added. Make sure you understand how and when to take each. Changed by:  Lucie Hones   alfuzosin  10 MG 24 hr tablet Commonly known as: UROXATRAL  Take 1 tablet (10 mg total) by mouth daily with breakfast. What changed: You were already taking a medication with the same name, and this prescription was added. Make sure you understand how and when to take each. Changed by: Myosha Cuadras   Caplyta 10.5 MG capsule Generic drug: lumateperone tosylate Take 42 mg by mouth daily.   DEPLIN PO Take 15 mg by mouth daily.   FLUoxetine 40 MG capsule Commonly known as: PROZAC Take 40 mg by mouth daily.   HYDROcodone -acetaminophen  5-325 MG tablet Commonly known as: Norco Take 1 tablet by mouth every 6 (six) hours as needed for up to 6 doses for moderate pain (pain score 4-6).   lamoTRIgine 200 MG tablet Commonly known as: LAMICTAL Take 200 mg by mouth daily.   rosuvastatin 10 MG tablet Commonly known as: CRESTOR Take 10 mg by mouth at bedtime.   telmisartan-hydrochlorothiazide 80-12.5 MG tablet Commonly known as: MICARDIS HCT Take 1 tablet by mouth daily.        Allergies:  Allergies  Allergen Reactions   Levaquin [Levofloxacin] Other (See Comments)    Tendon problems   Nsaids Other (See Comments)  CKD   Other Other (See Comments)    Intolerance to eggs - sinus drainage Intolerance to eggs - sinus drainage     Family History: No family history on file.  Social History:   reports that he has never smoked. He has never used smokeless tobacco. He reports that he does not currently use alcohol. He reports that he does not use drugs.  Physical Exam: BP (!) 147/95 (BP Location: Left Arm, Patient Position: Sitting, Cuff Size: Large)   Pulse 96   Ht 6' (1.829 m)   Wt 239 lb (108.4 kg)   SpO2 97%   BMI 32.41 kg/m   Constitutional:  Alert and oriented, no acute distress, nontoxic appearing HEENT: [place], AT Cardiovascular: No clubbing, cyanosis, or edema Respiratory: Normal respiratory effort, no increased work of  breathing Skin: No rashes, bruises or suspicious lesions Neurologic: Grossly intact, no focal deficits, moving all 4 extremities Psychiatric: Normal mood and affect  Laboratory Data: Results for orders placed or performed in visit on 07/18/24  Bladder Scan (Post Void Residual) in office   Collection Time: 07/18/24 10:59 AM  Result Value Ref Range   Scan Result    Assessment & Plan:   1. Benign prostatic hyperplasia with incomplete bladder emptying (Primary) Symptoms improved on alfuzosin  and he tolerated this well.  His bladder scan is elevated today, though he has been off the alpha-blocker for some time.  Will resume it and have him follow-up as scheduled. - Bladder Scan (Post Void Residual) in office - alfuzosin  (UROXATRAL ) 10 MG 24 hr tablet; Take 1 tablet (10 mg total) by mouth daily with breakfast.  Dispense: 90 tablet; Refill: 3   Return for Keep follow-up as scheduled.  Lucie Hones, PA-C  [facility] [place] [address] [place], [place] [address] [phone]

## 2024-07-25 ENCOUNTER — Ambulatory Visit

## 2024-07-25 ENCOUNTER — Inpatient Hospital Stay
Admission: RE | Admit: 2024-07-25 | Discharge: 2024-07-25 | Attending: Orthopedic Surgery | Admitting: Orthopedic Surgery

## 2024-07-25 DIAGNOSIS — M25511 Pain in right shoulder: Secondary | ICD-10-CM

## 2024-07-26 ENCOUNTER — Ambulatory Visit (INDEPENDENT_AMBULATORY_CARE_PROVIDER_SITE_OTHER)

## 2024-07-26 DIAGNOSIS — L821 Other seborrheic keratosis: Secondary | ICD-10-CM

## 2024-07-26 DIAGNOSIS — L814 Other melanin hyperpigmentation: Secondary | ICD-10-CM | POA: Diagnosis not present

## 2024-07-26 DIAGNOSIS — C4490 Unspecified malignant neoplasm of skin, unspecified: Secondary | ICD-10-CM

## 2024-07-26 DIAGNOSIS — L578 Other skin changes due to chronic exposure to nonionizing radiation: Secondary | ICD-10-CM | POA: Diagnosis not present

## 2024-07-26 DIAGNOSIS — W908XXA Exposure to other nonionizing radiation, initial encounter: Secondary | ICD-10-CM

## 2024-07-26 DIAGNOSIS — Z86018 Personal history of other benign neoplasm: Secondary | ICD-10-CM

## 2024-07-26 DIAGNOSIS — D1801 Hemangioma of skin and subcutaneous tissue: Secondary | ICD-10-CM | POA: Diagnosis not present

## 2024-07-26 DIAGNOSIS — D229 Melanocytic nevi, unspecified: Secondary | ICD-10-CM

## 2024-07-26 NOTE — Patient Instructions (Signed)

## 2024-07-26 NOTE — Progress Notes (Signed)
°  °  Subjective   Blake Strickland is a 63 y.o. male who presents for the following: Lesion(s) of concern . Patient is established patient   Today patient reports: Area of concern on the back Area of concern on the chest   Review of Systems:    No other skin or systemic complaints except as noted in HPI or Assessment and Plan.  The following portions of the chart were reviewed this encounter and updated as appropriate: medications, allergies, medical history  Relevant Medical History:  Personal history of non melanoma skin cancer - see medical history for full details   Objective  (SKPE) Well appearing patient in no apparent distress; mood and affect are within normal limits. Examination was performed of the: Focused Exam of: Back chest abdomen arms   Examination notable for: Angioma(s): Scattered red vascular papule(s)  , Lentigo/lentigines: Scattered pigmented macules that are tan to brown in color and are somewhat non-uniform in shape and concentrated in the sun-exposed areas, Nevus/nevi: Scattered well-demarcated, regular, pigmented macule(s) and/or papule(s)  , Seborrheic Keratosis(es): Stuck-on appearing keratotic papule(s) on the trunk, none  irritated with redness, crusting, edema, and/or partial avulsion, Actinic Damage/Elastosis: chronic sun damage: dyspigmentation, telangiectasia, and wrinkling      Assessment & Plan  (SKAP)   BENIGN SKIN FINDINGS  - Lentigines  - Seborrheic keratoses  - Hemangiomas   - Nevus/Multiple Benign Nevi - Reassurance provided regarding the benign appearance of lesions noted on exam today; no treatment is indicated in the absence of symptoms/changes. - Reinforced importance of photoprotective strategies including liberal and frequent sunscreen use of a broad-spectrum SPF 30 or greater, use of protective clothing, and sun avoidance for prevention of cutaneous malignancy and photoaging.  Counseled patient on the importance of regular self-skin monitoring  as well as routine clinical skin examinations as scheduled.   ACTINIC DAMAGE - Chronic condition, secondary to cumulative UV/sun exposure - Recommend daily broad spectrum sunscreen SPF 30+ to sun-exposed areas, reapply every 2 hours as needed.  - Staying in the shade or wearing long sleeves, sun glasses (UVA+UVB protection) and wide brim hats (4-inch brim around the entire circumference of the hat) are also recommended for sun protection.  - Call for new or changing lesions.  Personal history of dysplastic nevi  - Reviewed medical history for full details  - Reviewed sun protective measures as above - Encouraged full body skin exams     Level of service outlined above   Patient instructions (SKPI)   Procedures, orders, diagnosis for this visit:    There are no diagnoses linked to this encounter.  Return to clinic: Return for Appointment as scheduled.  I, Emerick Ege, CMA am acting as scribe for Lauraine JAYSON Kanaris, MD.   Documentation: I have reviewed the above documentation for accuracy and completeness, and I agree with the above.  Lauraine JAYSON Kanaris, MD

## 2024-12-28 ENCOUNTER — Ambulatory Visit: Admitting: Urology

## 2025-01-24 ENCOUNTER — Ambulatory Visit: Admitting: Dermatology
